# Patient Record
Sex: Female | Born: 1990 | Race: White | Hispanic: Yes | Marital: Single | Smoking: Never smoker
Health system: Southern US, Community
[De-identification: ages and names within clinical notes are randomized; demographics above are authoritative.]

## PROBLEM LIST (undated history)

## (undated) DIAGNOSIS — F410 Panic disorder [episodic paroxysmal anxiety] without agoraphobia: Secondary | ICD-10-CM

---
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
## 2011-01-07 ENCOUNTER — Emergency Department (HOSPITAL_COMMUNITY)
Admission: EM | Admit: 2011-01-07 | Discharge: 2011-01-07 | Disposition: A | Discharge status: Home / Self Care | Payer: Self-pay | Attending: Emergency Medicine | Admitting: Emergency Medicine

## 2011-01-07 DIAGNOSIS — R070 Pain in throat: Secondary | ICD-10-CM | POA: Insufficient documentation

## 2011-01-07 DIAGNOSIS — R05 Cough: Secondary | ICD-10-CM | POA: Insufficient documentation

## 2011-01-07 DIAGNOSIS — R059 Cough, unspecified: Secondary | ICD-10-CM | POA: Insufficient documentation

## 2011-01-07 DIAGNOSIS — R6889 Other general symptoms and signs: Secondary | ICD-10-CM | POA: Insufficient documentation

## 2011-01-07 DIAGNOSIS — J358 Other chronic diseases of tonsils and adenoids: Secondary | ICD-10-CM | POA: Insufficient documentation

## 2014-04-06 ENCOUNTER — Emergency Department (HOSPITAL_COMMUNITY): Payer: Self-pay

## 2014-04-06 ENCOUNTER — Emergency Department (HOSPITAL_COMMUNITY)
Admission: EM | Admit: 2014-04-06 | Discharge: 2014-04-06 | Disposition: A | Discharge status: Home / Self Care | Payer: Self-pay | Attending: Emergency Medicine | Admitting: Emergency Medicine

## 2014-04-06 ENCOUNTER — Encounter (HOSPITAL_COMMUNITY): Payer: Self-pay | Admitting: Emergency Medicine

## 2014-04-06 DIAGNOSIS — R0602 Shortness of breath: Secondary | ICD-10-CM | POA: Insufficient documentation

## 2014-04-06 DIAGNOSIS — J029 Acute pharyngitis, unspecified: Secondary | ICD-10-CM | POA: Insufficient documentation

## 2014-04-06 DIAGNOSIS — R1110 Vomiting, unspecified: Secondary | ICD-10-CM | POA: Insufficient documentation

## 2014-04-06 DIAGNOSIS — Z3202 Encounter for pregnancy test, result negative: Secondary | ICD-10-CM | POA: Insufficient documentation

## 2014-04-06 DIAGNOSIS — R000 Tachycardia, unspecified: Secondary | ICD-10-CM | POA: Insufficient documentation

## 2014-04-06 DIAGNOSIS — R509 Fever, unspecified: Secondary | ICD-10-CM | POA: Insufficient documentation

## 2014-04-06 LAB — CBC
HCT: 37.6 % (ref 36.0–46.0)
Hemoglobin: 12.7 g/dL (ref 12.0–15.0)
MCH: 28.4 pg (ref 26.0–34.0)
MCHC: 33.8 g/dL (ref 30.0–36.0)
MCV: 84.1 fL (ref 78.0–100.0)
PLATELETS: 300 10*3/uL (ref 150–400)
RBC: 4.47 MIL/uL (ref 3.87–5.11)
RDW: 12.6 % (ref 11.5–15.5)
WBC: 17 10*3/uL — ABNORMAL HIGH (ref 4.0–10.5)

## 2014-04-06 LAB — RAPID STREP SCREEN (MED CTR MEBANE ONLY): Streptococcus, Group A Screen (Direct): NEGATIVE

## 2014-04-06 LAB — BASIC METABOLIC PANEL
ANION GAP: 22 — AB (ref 5–15)
BUN: 10 mg/dL (ref 6–23)
CHLORIDE: 95 meq/L — AB (ref 96–112)
CO2: 20 mEq/L (ref 19–32)
CREATININE: 0.61 mg/dL (ref 0.50–1.10)
Calcium: 9.4 mg/dL (ref 8.4–10.5)
Glucose, Bld: 99 mg/dL (ref 70–99)
Potassium: 3.5 mEq/L — ABNORMAL LOW (ref 3.7–5.3)
Sodium: 137 mEq/L (ref 137–147)

## 2014-04-06 LAB — I-STAT CG4 LACTIC ACID, ED
Lactic Acid, Venous: 3.44 mmol/L — ABNORMAL HIGH (ref 0.5–2.2)
Lactic Acid, Venous: 0.94 mmol/L (ref 0.5–2.2)

## 2014-04-06 MED ORDER — SODIUM CHLORIDE 0.9 % IV BOLUS (SEPSIS)
2000.0000 mL | Freq: Once | INTRAVENOUS | Status: AC
  Administered 2014-04-06: 2000 mL via INTRAVENOUS

## 2014-04-06 MED ORDER — ACETAMINOPHEN 325 MG PO TABS
650.0000 mg | ORAL_TABLET | Freq: Four times a day (QID) | ORAL | Status: DC | PRN
  Administered 2014-04-06: 650 mg via ORAL
  Filled 2014-04-06: qty 2

## 2014-04-06 MED ORDER — KETOROLAC TROMETHAMINE 30 MG/ML IJ SOLN
30.0000 mg | Freq: Once | INTRAMUSCULAR | Status: AC
  Administered 2014-04-06: 30 mg via INTRAVENOUS
  Filled 2014-04-06: qty 1

## 2014-04-06 MED ORDER — SODIUM CHLORIDE 0.9 % IV BOLUS (SEPSIS)
1000.0000 mL | Freq: Once | INTRAVENOUS | Status: AC
  Administered 2014-04-06: 1000 mL via INTRAVENOUS

## 2014-04-06 NOTE — ED Provider Notes (Signed)
CSN: [id number]     Arrival date & time 04/06/14  1635 History   First MD Initiated Contact with Patient 04/06/14 1657     Chief Complaint  Patient presents with  . Fever  . Sore Throat     (Consider location/radiation/quality/duration/timing/severity/associated sxs/prior Treatment) Patient is a 23 y.o. female presenting with fever and pharyngitis. The history is provided by the patient.  Fever Max temp prior to arrival:  103 Temp source:  Oral Severity:  Moderate Onset quality:  Gradual Duration:  2 days Timing:  Intermittent Progression:  Unchanged Chronicity:  New Relieved by:  Nothing Worsened by:  Nothing tried Associated symptoms: cough, sore throat and vomiting   Associated symptoms: no chest pain and no rhinorrhea   Sore Throat Associated symptoms include shortness of breath. Pertinent negatives include no chest pain.    History reviewed. No pertinent past medical history. History reviewed. No pertinent past surgical history. No family history on file. History  Substance Use Topics  . Smoking status: Never Smoker   . Smokeless tobacco: Not on file  . Alcohol Use: No   OB History   Grav Para Term Preterm Abortions TAB SAB Ect Mult Living                 Review of Systems  Constitutional: Positive for fever.  HENT: Positive for sore throat. Negative for rhinorrhea.   Respiratory: Positive for cough and shortness of breath.   Cardiovascular: Negative for chest pain and leg swelling.  Gastrointestinal: Positive for vomiting.  All other systems reviewed and are negative.     Allergies  Nickel  Home Medications   Prior to Admission medications   Medication Sig Start Date End Date Taking? Authorizing Provider  Pseudoephedrine-APAP-DM (DAYQUIL MULTI-SYMPTOM COLD/FLU PO) Take 1 capsule by mouth daily as needed (for cold).   Yes Historical Provider, MD  sodium-potassium bicarbonate (ALKA-SELTZER GOLD) TBEF dissolvable tablet Take 1 tablet by mouth 2 (two)  times daily as needed (for congestion).    Yes Historical Provider, MD   BP 119/97  Pulse 125  Temp(Src) 103.3 F (39.6 C) (Oral)  Resp 28  SpO2 99% Physical Exam  Nursing note and vitals reviewed. Constitutional: She is oriented to person, place, and time. She appears well-developed and well-nourished. No distress.  HENT:  Head: Normocephalic and atraumatic.  Mouth/Throat: Oropharynx is clear and moist.  Eyes: EOM are normal. Pupils are equal, round, and reactive to light.  Neck: Normal range of motion. Neck supple.  Cardiovascular: Regular rhythm.  Tachycardia present.  Exam reveals no friction rub.   No murmur heard. Pulmonary/Chest: Effort normal and breath sounds normal. No respiratory distress. She has no wheezes. She has no rales.  Abdominal: Soft. She exhibits no distension. There is no tenderness. There is no rebound.  Musculoskeletal: Normal range of motion. She exhibits no edema.  Neurological: She is alert and oriented to person, place, and time.  Skin: She is not diaphoretic.    ED Course  Procedures (including critical care time) Labs Review Labs Reviewed  CBC - Abnormal; Notable for the following:    WBC 17.0 (*)    All other components within normal limits  BASIC METABOLIC PANEL - Abnormal; Notable for the following:    Potassium 3.5 (*)    Chloride 95 (*)    Anion gap 22 (*)    All other components within normal limits  I-STAT CG4 LACTIC ACID, ED - Abnormal; Notable for the following:    Lactic Acid, Venous  3.44 (*)    All other components within normal limits  RAPID STREP SCREEN  CULTURE, GROUP A STREP  POC URINE PREG, ED  I-STAT CG4 LACTIC ACID, ED    Imaging Review Dg Chest 2 View  04/06/2014   CLINICAL DATA:  Cough and fever  EXAM: CHEST  2 VIEW  COMPARISON:  None.  FINDINGS: Lungs are clear. Heart size and pulmonary vascularity are normal. No adenopathy. No bone lesions.  IMPRESSION: No abnormality noted.   Electronically Signed   By: Bretta Bang M.D.   On: 04/06/2014 17:53     EKG Interpretation None      MDM   Final diagnoses:  Viral pharyngitis    26F presents with sore throat, fever for past 2 days. Some sick contacts at school. Vomited x 2 today. No abdominal pain, no dysuria. Associated cough. No rhinorrhea, no otalgia.  Patient on exam with red oropharynx, no exudates. Neck supple, uvula midline, no airway obstruction. LIkely viral pharyngitis, will swab for strep. Fluids, antipyretics given. No concern for meningitis with supple neck.  Feeling better after fluids, lactate trended down. Patient stable for discharge.   Elwin Mocha, MD 04/06/14 [id number]

## 2014-04-06 NOTE — ED Notes (Addendum)
Pt presents to department for evaluation of sore throat and fever. Pt screaming, breathing rapidly and crying upon arrival to triage. Also c/o numbness and tingling to bilateral arms and legs. Pt is alert and oriented x4.

## 2014-04-06 NOTE — ED Notes (Signed)
NOTIFIED DR. Gwendolyn Grant FOR PATIENTS PANIC LAB RESULTS OF CG4+ LACTIC ACID = 3.44 mmol/L . :33 PM ,04/06/2014.

## 2014-04-06 NOTE — ED Notes (Signed)
Brought pt back to room via wheelchair; pt was appearing to be hyperventilating; kept talking to pt to ease her and pt started to calm down and started to breath at a normal rate; pt undressed and got into a gown; placed her on monitor, continuous pulse oximetry and blood pressure cuff; Christina, RN present in room; pt speaking and breathing at a normal rate, 110-112 heart rate and pt was talking about calling her mother to let her know where she was; told pt she was going to be ok; pt smiled and said thank you

## 2014-04-06 NOTE — Discharge Instructions (Signed)

## 2014-04-07 LAB — POC URINE PREG, ED: Preg Test, Ur: NEGATIVE

## 2014-04-08 LAB — CULTURE, GROUP A STREP

## 2014-05-15 ENCOUNTER — Emergency Department (HOSPITAL_COMMUNITY)
Admission: EM | Admit: 2014-05-15 | Discharge: 2014-05-15 | Disposition: A | Discharge status: Home / Self Care | Payer: Self-pay | Attending: Emergency Medicine | Admitting: Emergency Medicine

## 2014-05-15 ENCOUNTER — Emergency Department (HOSPITAL_COMMUNITY): Payer: Self-pay

## 2014-05-15 ENCOUNTER — Encounter (HOSPITAL_COMMUNITY): Payer: Self-pay | Admitting: Emergency Medicine

## 2014-05-15 DIAGNOSIS — R05 Cough: Secondary | ICD-10-CM | POA: Insufficient documentation

## 2014-05-15 DIAGNOSIS — Z79899 Other long term (current) drug therapy: Secondary | ICD-10-CM | POA: Insufficient documentation

## 2014-05-15 DIAGNOSIS — J209 Acute bronchitis, unspecified: Secondary | ICD-10-CM | POA: Insufficient documentation

## 2014-05-15 DIAGNOSIS — J40 Bronchitis, not specified as acute or chronic: Secondary | ICD-10-CM

## 2014-05-15 DIAGNOSIS — R059 Cough, unspecified: Secondary | ICD-10-CM

## 2014-05-15 MED ORDER — AEROCHAMBER PLUS FLO-VU MEDIUM MISC
1.0000 | Freq: Once | Status: AC
  Administered 2014-05-15: 1
  Filled 2014-05-15: qty 1

## 2014-05-15 MED ORDER — HYDROCOD POLST-CHLORPHEN POLST 10-8 MG/5ML PO LQCR: 5.0000 mL | Freq: Two times a day (BID) | ORAL | Status: DC | PRN

## 2014-05-15 MED ORDER — HYDROCOD POLST-CHLORPHEN POLST 10-8 MG/5ML PO LQCR
5.0000 mL | Freq: Once | ORAL | Status: AC
  Administered 2014-05-15: 5 mL via ORAL
  Filled 2014-05-15: qty 5

## 2014-05-15 MED ORDER — BENZONATATE 100 MG PO CAPS
100.0000 mg | ORAL_CAPSULE | Freq: Once | ORAL | Status: AC
  Administered 2014-05-15: 100 mg via ORAL
  Filled 2014-05-15: qty 1

## 2014-05-15 MED ORDER — ALBUTEROL SULFATE HFA 108 (90 BASE) MCG/ACT IN AERS
6.0000 | INHALATION_SPRAY | Freq: Once | RESPIRATORY_TRACT | Status: AC
  Administered 2014-05-15: 6 via RESPIRATORY_TRACT
  Filled 2014-05-15: qty 6.7

## 2014-05-15 MED ORDER — AZITHROMYCIN 250 MG PO TABS: 250.0000 mg | ORAL_TABLET | Freq: Every day | ORAL | Status: DC

## 2014-05-15 NOTE — ED Provider Notes (Signed)
CSN: [id number]     Arrival date & time 05/15/14  0806 History   First MD Initiated Contact with Patient 05/15/14 0809     Chief Complaint  Patient presents with  . Cough  . Hemoptysis     (Consider location/radiation/quality/duration/timing/severity/associated sxs/prior Treatment) Patient is a 23 y.o. female presenting with cough.  Cough Cough characteristics:  Dry (Initially productive) Severity:  Severe Onset quality:  Gradual Duration:  6 weeks Timing:  Constant Progression:  Waxing and waning Context: sick contacts and upper respiratory infection   Context comment:  Diagnosed with pharyngitis 6 weeks ago. Relieved by:  Nothing Ineffective treatments: cough drops, Dayquil. Associated symptoms: shortness of breath (feeling that she can't get a full breath), sinus congestion and sore throat   Associated symptoms: no fever   Associated symptoms comment:  Post tussive emesis   History reviewed. No pertinent past medical history. History reviewed. No pertinent past surgical history. No family history on file. History  Substance Use Topics  . Smoking status: Never Smoker   . Smokeless tobacco: Not on file  . Alcohol Use: Yes     Comment: socially   OB History   Grav Para Term Preterm Abortions TAB SAB Ect Mult Living                 Review of Systems  Constitutional: Negative for fever.  HENT: Positive for sore throat.   Respiratory: Positive for cough and shortness of breath (feeling that she can't get a full breath).   All other systems reviewed and are negative.     Allergies  Nickel  Home Medications   Prior to Admission medications   Medication Sig Start Date End Date Taking? Authorizing Provider  acetaminophen (TYLENOL) 500 MG tablet Take 1,000 mg by mouth every 6 (six) hours as needed for moderate pain.   Yes Historical Provider, MD  DM-Phenylephrine-Acetaminophen (VICKS DAYQUIL MULTI-SYMPTOM) 10-5-325 MG/15ML LIQD Take 10 mLs by mouth 2 (two) times  daily as needed (for cough and congestion).   Yes Historical Provider, MD   Ht 5\' 4"  (1.626 m)  Wt 135 lb (61.236 kg)  BMI 23.16 kg/m2  SpO2 99%  LMP 05/11/2014 Physical Exam  Nursing note and vitals reviewed. Constitutional: She is oriented to person, place, and time. She appears well-developed and well-nourished. No distress.  HENT:  Head: Normocephalic and atraumatic.  Mouth/Throat: Mucous membranes are normal. Posterior oropharyngeal erythema present. No oropharyngeal exudate, posterior oropharyngeal edema or tonsillar abscesses.  Eyes: Conjunctivae are normal. Pupils are equal, round, and reactive to light. No scleral icterus.  Neck: Neck supple.  Cardiovascular: Normal rate, regular rhythm, normal heart sounds and intact distal pulses.   No murmur heard. Pulmonary/Chest: Effort normal and breath sounds normal. No stridor. No respiratory distress. She has no rales.  Frequently coughing  Abdominal: Soft. Bowel sounds are normal. She exhibits no distension. There is no tenderness.  Musculoskeletal: Normal range of motion. She exhibits no edema.  Neurological: She is alert and oriented to person, place, and time.  Skin: Skin is warm and dry. No rash noted.  Psychiatric: She has a normal mood and affect. Her behavior is normal.    ED Course  Procedures (including critical care time) Labs Review Labs Reviewed - No data to display  Imaging Review Dg Chest 2 View  05/15/2014   CLINICAL DATA:  Cough.  EXAM: CHEST  2 VIEW  COMPARISON:  04/06/2014  FINDINGS: The heart size and mediastinal contours are within normal limits. Both lungs  are clear. The visualized skeletal structures are unremarkable.  IMPRESSION: No active cardiopulmonary disease.   Electronically Signed   By: Signa Kellaylor  Stroud M.D.   On: 05/15/2014 10:04  All radiology studies independently viewed by me.      EKG Interpretation None      MDM   Final diagnoses:  Cough  Bronchitis    23 yo female with cough for  several weeks, now worsening.  Had a little blood tinged sputum, prompting her ED visit today.  Nontoxic with stable vitals.  She is, however, coughing frequently.  Not at high risk for TB.  She had all her vaccines a few years ago while in the [employer].    Plan CXR, albuterol, and reassess.    CXR negative.  Cough a little better after tussionex.  Plan dc with azithromycin and PCP follow up. Return precautions given.  Warnell Foresterrey Samnang Shugars, MD 05/16/14 1258

## 2014-05-15 NOTE — ED Notes (Signed)
Per pt. She was diagnosed with laryngitis on sept 7, and has not stopped coughing since. Pt states that around 0700 she coughed up a small amount of bright red blood one time.

## 2014-05-15 NOTE — Discharge Instructions (Signed)
Cough, Adult  A cough is a reflex that helps clear your throat and airways. It can help heal the body or may be a reaction to an irritated airway. A cough may only last 2 or 3 weeks (acute) or may last more than 8 weeks (chronic).  CAUSES Acute cough:  Viral or bacterial infections. Chronic cough:  Infections.  Allergies.  Asthma.  Post-nasal drip.  Smoking.  Heartburn or acid reflux.  Some medicines.  Chronic lung problems (COPD).  Cancer. SYMPTOMS   Cough.  Fever.  Chest pain.  Increased breathing rate.  High-pitched whistling sound when breathing (wheezing).  Colored mucus that you cough up (sputum). TREATMENT   A bacterial cough may be treated with antibiotic medicine.  A viral cough must run its course and will not respond to antibiotics.  Your caregiver may recommend other treatments if you have a chronic cough. HOME CARE INSTRUCTIONS   Only take over-the-counter or prescription medicines for pain, discomfort, or fever as directed by your caregiver. Use cough suppressants only as directed by your caregiver.  Use a cold steam vaporizer or humidifier in your bedroom or home to help loosen secretions.  Sleep in a semi-upright position if your cough is worse at night.  Rest as needed.  Stop smoking if you smoke. SEEK IMMEDIATE MEDICAL CARE IF:   You have pus in your sputum.  Your cough starts to worsen.  You cannot control your cough with suppressants and are losing sleep.  You begin coughing up blood.  You have difficulty breathing.  You develop pain which is getting worse or is uncontrolled with medicine.  You have a fever. MAKE SURE YOU:   Understand these instructions.  Will watch your condition.  Will get help right away if you are not doing well or get worse. Document Released: 01/11/2011 Document Revised: 10/07/2011 Document Reviewed: 01/11/2011 Exit[employer] Patient Information 2015 [employer]. This information is not intended  to replace advice given to you by your health care provider. Make sure you discuss any questions you have with your health care provider.  

## 2015-09-15 DIAGNOSIS — R000 Tachycardia, unspecified: Secondary | ICD-10-CM | POA: Diagnosis not present

## 2015-09-15 DIAGNOSIS — R42 Dizziness and giddiness: Secondary | ICD-10-CM | POA: Insufficient documentation

## 2015-09-15 DIAGNOSIS — R1110 Vomiting, unspecified: Secondary | ICD-10-CM | POA: Diagnosis not present

## 2015-09-15 DIAGNOSIS — Z792 Long term (current) use of antibiotics: Secondary | ICD-10-CM | POA: Insufficient documentation

## 2015-09-15 DIAGNOSIS — J3489 Other specified disorders of nose and nasal sinuses: Secondary | ICD-10-CM | POA: Diagnosis not present

## 2015-09-15 DIAGNOSIS — R509 Fever, unspecified: Secondary | ICD-10-CM | POA: Insufficient documentation

## 2015-09-15 DIAGNOSIS — R1310 Dysphagia, unspecified: Secondary | ICD-10-CM | POA: Insufficient documentation

## 2015-09-15 DIAGNOSIS — J029 Acute pharyngitis, unspecified: Secondary | ICD-10-CM | POA: Insufficient documentation

## 2015-09-15 DIAGNOSIS — R05 Cough: Secondary | ICD-10-CM | POA: Diagnosis not present

## 2015-09-16 ENCOUNTER — Emergency Department (HOSPITAL_COMMUNITY)
Admission: EM | Admit: 2015-09-16 | Discharge: 2015-09-16 | Disposition: A | Discharge status: Home / Self Care | Payer: BLUE CROSS/BLUE SHIELD | Attending: Emergency Medicine | Admitting: Emergency Medicine

## 2015-09-16 ENCOUNTER — Encounter (HOSPITAL_COMMUNITY): Payer: Self-pay | Admitting: Emergency Medicine

## 2015-09-16 ENCOUNTER — Emergency Department (HOSPITAL_COMMUNITY): Payer: BLUE CROSS/BLUE SHIELD

## 2015-09-16 DIAGNOSIS — R6889 Other general symptoms and signs: Secondary | ICD-10-CM

## 2015-09-16 LAB — URINALYSIS, ROUTINE W REFLEX MICROSCOPIC
GLUCOSE, UA: NEGATIVE mg/dL
NITRITE: NEGATIVE
PH: 5.5 (ref 5.0–8.0)
PROTEIN: NEGATIVE mg/dL
Specific Gravity, Urine: 1.029 (ref 1.005–1.030)

## 2015-09-16 LAB — POC URINE PREG, ED: Preg Test, Ur: NEGATIVE

## 2015-09-16 LAB — CBC WITH DIFFERENTIAL/PLATELET
BASOS ABS: 0 10*3/uL (ref 0.0–0.1)
Basophils Relative: 0 %
EOS ABS: 0 10*3/uL (ref 0.0–0.7)
Eosinophils Relative: 1 %
HCT: 39.1 % (ref 36.0–46.0)
Hemoglobin: 13.5 g/dL (ref 12.0–15.0)
Lymphocytes Relative: 31 %
Lymphs Abs: 1.5 10*3/uL (ref 0.7–4.0)
MCH: 29.7 pg (ref 26.0–34.0)
MCHC: 34.5 g/dL (ref 30.0–36.0)
MCV: 86.1 fL (ref 78.0–100.0)
MONO ABS: 0.5 10*3/uL (ref 0.1–1.0)
MONOS PCT: 11 %
Neutro Abs: 2.7 10*3/uL (ref 1.7–7.7)
Neutrophils Relative %: 57 %
Platelets: 207 10*3/uL (ref 150–400)
RBC: 4.54 MIL/uL (ref 3.87–5.11)
RDW: 12.7 % (ref 11.5–15.5)
WBC: 4.8 10*3/uL (ref 4.0–10.5)

## 2015-09-16 LAB — BASIC METABOLIC PANEL
Anion gap: 13 (ref 5–15)
BUN: 10 mg/dL (ref 6–20)
CALCIUM: 9.4 mg/dL (ref 8.9–10.3)
CO2: 23 mmol/L (ref 22–32)
CREATININE: 0.59 mg/dL (ref 0.44–1.00)
Chloride: 103 mmol/L (ref 101–111)
GFR calc Af Amer: >60 mL/min (ref >60)
Glucose, Bld: 89 mg/dL (ref 65–99)
Potassium: 4.2 mmol/L (ref 3.5–5.1)
Sodium: 139 mmol/L (ref 135–145)

## 2015-09-16 LAB — URINE MICROSCOPIC-ADD ON

## 2015-09-16 LAB — RAPID STREP SCREEN (MED CTR MEBANE ONLY): Streptococcus, Group A Screen (Direct): NEGATIVE

## 2015-09-16 MED ORDER — ACETAMINOPHEN 325 MG PO TABS
ORAL_TABLET | ORAL | Status: AC
  Filled 2015-09-16: qty 2

## 2015-09-16 MED ORDER — SODIUM CHLORIDE 0.9 % IV BOLUS (SEPSIS)
1000.0000 mL | Freq: Once | INTRAVENOUS | Status: AC
  Administered 2015-09-16: 1000 mL via INTRAVENOUS

## 2015-09-16 MED ORDER — MAGIC MOUTHWASH W/LIDOCAINE: 5.0000 mL | Freq: Three times a day (TID) | ORAL | Status: DC | PRN

## 2015-09-16 MED ORDER — DEXAMETHASONE SODIUM PHOSPHATE 10 MG/ML IJ SOLN
10.0000 mg | Freq: Once | INTRAMUSCULAR | Status: AC
  Administered 2015-09-16: 10 mg via INTRAVENOUS
  Filled 2015-09-16: qty 1

## 2015-09-16 MED ORDER — IBUPROFEN 600 MG PO TABS: 600.0000 mg | ORAL_TABLET | Freq: Four times a day (QID) | ORAL | Status: DC | PRN

## 2015-09-16 MED ORDER — ACETAMINOPHEN 325 MG PO TABS
650.0000 mg | ORAL_TABLET | Freq: Once | ORAL | Status: AC
  Administered 2015-09-16: 650 mg via ORAL

## 2015-09-16 NOTE — ED Provider Notes (Addendum)
CSN: [id number]     Arrival date & time 09/15/15  2342 History  By signing my name below, I, Traci Oconnell, attest that this documentation has been prepared under the direction and in the presence of Derwood Kaplan, MD. Electronically Signed: Angelene Giovanni, ED Scribe. 09/16/2015. 12:51 AM.    Chief Complaint  Patient presents with  . Fever  . Cough  . Generalized Body Aches  . Chills   The history is provided by the patient. No language interpreter was used.   HPI Comments: Traci Oconnell is a 25 y.o. female with a hx of Raynaud's syndrome who presents to the Emergency Department complaining of gradually worsening persistent non-productive cough that becomes productive at night with white and green sputum onset 5 days ago. She reports associated sore throat, trouble swallowing, decreased in drinking, rhinorrhea, fever of 102.3 PTA, chills, and one episode of vomiting today. She reports sick contacts at work. She explains that she is here because she had an episode of dizziness this evening and almost fainted. She states that she has been taking Dayquil and Nyquil with no relief. Pt's flu vaccine is not UTD. Pt's LNMP was on 09/05/15. She denies taking any medication for her raynaud's syndrome. No SOB, CP, or abdominal pain.    History reviewed. No pertinent past medical history. History reviewed. No pertinent past surgical history. No family history on file. Social History  Substance Use Topics  . Smoking status: Never Smoker   . Smokeless tobacco: None  . Alcohol Use: Yes     Comment: socially   OB History    No data available     Review of Systems  Constitutional: Positive for fever and chills.  HENT: Positive for rhinorrhea, sore throat and trouble swallowing.   Respiratory: Positive for cough. Negative for shortness of breath.   Cardiovascular: Negative for chest pain.  Gastrointestinal: Positive for vomiting. Negative for abdominal pain.      Allergies   Nickel  Home Medications   Prior to Admission medications   Medication Sig Start Date End Date Taking? Authorizing Provider  acetaminophen (TYLENOL) 500 MG tablet Take 1,000 mg by mouth every 6 (six) hours as needed for moderate pain.    Historical Provider, MD  azithromycin (ZITHROMAX) 250 MG tablet Take 1 tablet (250 mg total) by mouth daily. Take first 2 tablets together, then 1 every day until finished. 05/15/14   Blake Divine, MD  chlorpheniramine-HYDROcodone (TUSSIONEX PENNKINETIC ER) 10-8 MG/5ML LQCR Take 5 mLs by mouth every 12 (twelve) hours as needed for cough. 05/15/14   Blake Divine, MD  DM-Phenylephrine-Acetaminophen (VICKS DAYQUIL MULTI-SYMPTOM) 10-5-325 MG/15ML LIQD Take 10 mLs by mouth 2 (two) times daily as needed (for cough and congestion).    Historical Provider, MD  ibuprofen (ADVIL,MOTRIN) 600 MG tablet Take 1 tablet (600 mg total) by mouth every 6 (six) hours as needed. 09/16/15   Derwood Kaplan, MD  magic mouthwash w/lidocaine SOLN Take 5 mLs by mouth 3 (three) times daily as needed for mouth pain. 09/16/15   Neri Vieyra Rhunette Croft, MD   BP 109/67 mmHg  Pulse 87  Temp(Src) 99.2 F (37.3 C) (Oral)  Resp 18  Ht  (1.626 m)  Wt 118 lb 1 oz (53.553 kg)  BMI 20.26 kg/m2  SpO2 100%  LMP 09/02/2015 (Approximate) Physical Exam  Constitutional: She appears well-developed and well-nourished. No distress.  HENT:  Head: Normocephalic and atraumatic.  Mouth/Throat: Posterior oropharyngeal erythema present. No oropharyngeal exudate.  Posterior pharynx erythema without any exudates Enlarged  tonsils   Eyes: Conjunctivae and EOM are normal. Pupils are equal, round, and reactive to light. Right eye exhibits no discharge. Left eye exhibits no discharge. No scleral icterus.  Neck: Normal range of motion. Neck supple. No JVD present. No thyromegaly present.  Cardiovascular: Normal rate, regular rhythm, normal heart sounds and intact distal pulses.  Exam reveals no gallop and no friction  rub.   No murmur heard. Pulmonary/Chest: Effort normal and breath sounds normal. No respiratory distress. She has no wheezes. She has no rales.  Lungs clear to auscultation   Abdominal: Soft. Bowel sounds are normal. She exhibits no distension and no mass. There is no tenderness.  Musculoskeletal: Normal range of motion. She exhibits no edema or tenderness.  Lymphadenopathy:    She has cervical adenopathy (anterior).  Neurological: She is alert. Coordination normal.  Skin: Skin is warm and dry. No rash noted. No erythema.  Psychiatric: She has a normal mood and affect. Her behavior is normal.  Nursing note and vitals reviewed.   ED Course  Procedures (including critical care time) DIAGNOSTIC STUDIES: Oxygen Saturation is 99% on RA, normal by my interpretation.    COORDINATION OF CARE: 12:50 AM- Pt advised of plan for treatment and pt agrees. Pt will receive IV fluids and Tylenol. She will also receive a chest x-ray for further evaluation.    Labs Review Labs Reviewed  URINALYSIS, ROUTINE W REFLEX MICROSCOPIC (NOT AT [facility]) - Abnormal; Notable for the following:    APPearance CLOUDY (*)    Hgb urine dipstick MODERATE (*)    Bilirubin Urine SMALL (*)    Ketones, ur >80 (*)    Leukocytes, UA SMALL (*)    All other components within normal limits  URINE MICROSCOPIC-ADD ON - Abnormal; Notable for the following:    Squamous Epithelial / LPF 0-5 (*)    Bacteria, UA RARE (*)    All other components within normal limits  RAPID STREP SCREEN (NOT AT [facility])  CULTURE, GROUP A STREP ([facility])  CBC WITH DIFFERENTIAL/PLATELET  BASIC METABOLIC PANEL  POC URINE PREG, ED    Imaging Review Dg Chest 2 View  09/16/2015  CLINICAL DATA:  Fever, chills, body aches and cough this evening. EXAM: CHEST  2 VIEW COMPARISON:  Chest radiograph May 15, 2014 FINDINGS: Cardiomediastinal silhouette is normal. The lungs are clear without pleural effusions or focal consolidations. Trachea projects midline and  there is no pneumothorax. Soft tissue planes and included osseous structures are non-suspicious. IMPRESSION: Normal chest. Electronically Signed   By: Awilda Metro M.D.   On: 09/16/2015 00:46     Derwood Kaplan, MD has personally reviewed and evaluated these images and lab results as part of his medical decision-making.   MDM   Final diagnoses:  Flu-like symptoms    I personally performed the services described in this documentation, which was scribed in my presence. The recorded information has been reviewed and is accurate.  Pt comes in with flu like symptoms and dizziness. Dizziness likely due to poor oral intake and dehydration.  Pt is febrile and has tachycardia - throat looks erythematous. Likely has flu - but she is healthy and well outside the treatment window. Triage Xrays and rapid strep pending. Will hydrate due to the near fainting and give decadron for inflammation.  Derwood Kaplan, MD 09/16/15 1610  Derwood Kaplan, MD 09/16/15 [phone]

## 2015-09-16 NOTE — Discharge Instructions (Signed)
Viral Infections °A viral infection can be caused by different types of viruses. Most viral infections are not serious and resolve on their own. However, some infections may cause severe symptoms and may lead to further complications. °SYMPTOMS °Viruses can frequently cause: °· Minor sore throat. °· Aches and pains. °· Headaches. °· Runny nose. °· Different types of rashes. °· Watery eyes. °· Tiredness. °· Cough. °· Loss of appetite. °· Gastrointestinal infections, resulting in nausea, vomiting, and diarrhea. °These symptoms do not respond to antibiotics because the infection is not caused by bacteria. However, you might catch a bacterial infection following the viral infection. This is sometimes called a "superinfection." Symptoms of such a bacterial infection may include: °· Worsening sore throat with pus and difficulty swallowing. °· Swollen neck glands. °· Chills and a high or persistent fever. °· Severe headache. °· Tenderness over the sinuses. °· Persistent overall ill feeling (malaise), muscle aches, and tiredness (fatigue). °· Persistent cough. °· Yellow, green, or brown mucus production with coughing. °HOME CARE INSTRUCTIONS  °· Only take over-the-counter or prescription medicines for pain, discomfort, diarrhea, or fever as directed by your caregiver. °· Drink enough water and fluids to keep your urine clear or pale yellow. Sports drinks can provide valuable electrolytes, sugars, and hydration. °· Get plenty of rest and maintain proper nutrition. Soups and broths with crackers or rice are fine. °SEEK IMMEDIATE MEDICAL CARE IF:  °· You have severe headaches, shortness of breath, chest pain, neck pain, or an unusual rash. °· You have uncontrolled vomiting, diarrhea, or you are unable to keep down fluids. °· You or your child has an oral temperature above 102° F (38.9° C), not controlled by medicine. °· Your baby is older than 3 months with a rectal temperature of 102° F (38.9° C) or higher. °· Your baby is 3  months old or younger with a rectal temperature of 100.4° F (38° C) or higher. °MAKE SURE YOU:  °· Understand these instructions. °· Will watch your condition. °· Will get help right away if you are not doing well or get worse. °  °This information is not intended to replace advice given to you by your health care provider. Make sure you discuss any questions you have with your health care provider. °  °Document Released: 04/24/2005 Document Revised: 10/07/2011 Document Reviewed: 12/21/2014 °Elsevier Interactive Patient Education ©2016 [employer] ° °

## 2015-09-16 NOTE — ED Notes (Signed)
Pt. reports fever , chills , body aches , persistent dry cough , nasal congestion/runny nose and emesis this evening .

## 2015-09-18 LAB — CULTURE, GROUP A STREP (THRC)

## 2015-09-27 ENCOUNTER — Ambulatory Visit (INDEPENDENT_AMBULATORY_CARE_PROVIDER_SITE_OTHER): Payer: BLUE CROSS/BLUE SHIELD | Admitting: Licensed Clinical Social Worker

## 2015-09-27 DIAGNOSIS — F324 Major depressive disorder, single episode, in partial remission: Secondary | ICD-10-CM | POA: Diagnosis not present

## 2015-10-03 ENCOUNTER — Ambulatory Visit (INDEPENDENT_AMBULATORY_CARE_PROVIDER_SITE_OTHER): Payer: BLUE CROSS/BLUE SHIELD | Admitting: Licensed Clinical Social Worker

## 2015-10-03 DIAGNOSIS — F324 Major depressive disorder, single episode, in partial remission: Secondary | ICD-10-CM | POA: Diagnosis not present

## 2015-10-11 ENCOUNTER — Ambulatory Visit (INDEPENDENT_AMBULATORY_CARE_PROVIDER_SITE_OTHER): Payer: BLUE CROSS/BLUE SHIELD | Admitting: Licensed Clinical Social Worker

## 2015-10-11 DIAGNOSIS — F324 Major depressive disorder, single episode, in partial remission: Secondary | ICD-10-CM | POA: Diagnosis not present

## 2015-10-18 ENCOUNTER — Ambulatory Visit: Payer: BLUE CROSS/BLUE SHIELD | Admitting: Licensed Clinical Social Worker

## 2015-11-01 ENCOUNTER — Ambulatory Visit: Payer: BLUE CROSS/BLUE SHIELD | Admitting: Licensed Clinical Social Worker

## 2015-11-03 ENCOUNTER — Emergency Department (HOSPITAL_COMMUNITY): Payer: 59

## 2015-11-03 ENCOUNTER — Emergency Department (HOSPITAL_COMMUNITY)
Admission: EM | Admit: 2015-11-03 | Discharge: 2015-11-03 | Disposition: A | Discharge status: Home / Self Care | Payer: 59 | Attending: Emergency Medicine | Admitting: Emergency Medicine

## 2015-11-03 ENCOUNTER — Encounter (HOSPITAL_COMMUNITY): Payer: Self-pay | Admitting: Emergency Medicine

## 2015-11-03 DIAGNOSIS — S60221A Contusion of right hand, initial encounter: Secondary | ICD-10-CM | POA: Diagnosis not present

## 2015-11-03 DIAGNOSIS — Y9389 Activity, other specified: Secondary | ICD-10-CM | POA: Diagnosis not present

## 2015-11-03 DIAGNOSIS — Y998 Other external cause status: Secondary | ICD-10-CM | POA: Insufficient documentation

## 2015-11-03 DIAGNOSIS — F410 Panic disorder [episodic paroxysmal anxiety] without agoraphobia: Secondary | ICD-10-CM | POA: Diagnosis not present

## 2015-11-03 DIAGNOSIS — S60511A Abrasion of right hand, initial encounter: Secondary | ICD-10-CM | POA: Diagnosis not present

## 2015-11-03 DIAGNOSIS — W228XXA Striking against or struck by other objects, initial encounter: Secondary | ICD-10-CM | POA: Insufficient documentation

## 2015-11-03 DIAGNOSIS — Y92003 Bedroom of unspecified non-institutional (private) residence as the place of occurrence of the external cause: Secondary | ICD-10-CM | POA: Diagnosis not present

## 2015-11-03 DIAGNOSIS — S6000XA Contusion of unspecified finger without damage to nail, initial encounter: Secondary | ICD-10-CM

## 2015-11-03 DIAGNOSIS — S6991XA Unspecified injury of right wrist, hand and finger(s), initial encounter: Secondary | ICD-10-CM | POA: Diagnosis present

## 2015-11-03 HISTORY — DX: Panic disorder (episodic paroxysmal anxiety): F41.0

## 2015-11-03 MED ORDER — HYDROCODONE-ACETAMINOPHEN 5-325 MG PO TABS
1.0000 | ORAL_TABLET | Freq: Once | ORAL | Status: DC
  Filled 2015-11-03: qty 1

## 2015-11-03 MED ORDER — IBUPROFEN 400 MG PO TABS
600.0000 mg | ORAL_TABLET | Freq: Once | ORAL | Status: AC
  Administered 2015-11-03: 600 mg via ORAL
  Filled 2015-11-03: qty 1

## 2015-11-03 NOTE — ED Provider Notes (Signed)
CSN: [id number]     Arrival date & time 11/03/15  2013 History  By signing my name below, I, Phillis Haggis, attest that this documentation has been prepared under the direction and in the presence of [facility], NP-C. Electronically Signed: Phillis Haggis, ED Scribe. 11/03/2015. 9:18 PM.  Chief Complaint  Patient presents with  . Hand Pain   Patient is a 25 y.o. female presenting with hand pain. The history is provided by the patient. No language interpreter was used.  Hand Pain This is a new problem. The current episode started 1 to 2 hours ago. The problem occurs constantly. The problem has been gradually worsening. She has tried nothing for the symptoms.  HPI Comments: Traci Oconnell is a 25 y.o. female who presents to the Emergency Department complaining of right hand pain onset two hours ago. Pt states that she had a panic attack and punched her wooden bedroom door a few times. She reports associated swelling to the hand. She currently rates her pain 5/10. She has placed ice on her hand to mild relief. She has not taken any pain medication PTA. She denies numbness or weakness. Pt's last tdap was in 2010.   Past Medical History  Diagnosis Date  . Panic attack    History reviewed. No pertinent past surgical history. No family history on file. Social History  Substance Use Topics  . Smoking status: Never Smoker   . Smokeless tobacco: None  . Alcohol Use: Yes     Comment: socially   OB History    No data available     Review of Systems  Musculoskeletal: Positive for arthralgias.       Right hand pain  Neurological: Negative for weakness and numbness.  All other systems reviewed and are negative.   Allergies  Nickel  Home Medications   Prior to Admission medications   Medication Sig Start Date End Date Taking? Authorizing Provider  acetaminophen (TYLENOL) 500 MG tablet Take 1,000 mg by mouth every 6 (six) hours as needed for moderate pain.    Historical Provider, MD   azithromycin (ZITHROMAX) 250 MG tablet Take 1 tablet (250 mg total) by mouth daily. Take first 2 tablets together, then 1 every day until finished. 05/15/14   Blake Divine, MD  chlorpheniramine-HYDROcodone (TUSSIONEX PENNKINETIC ER) 10-8 MG/5ML LQCR Take 5 mLs by mouth every 12 (twelve) hours as needed for cough. 05/15/14   Blake Divine, MD  DM-Phenylephrine-Acetaminophen (VICKS DAYQUIL MULTI-SYMPTOM) 10-5-325 MG/15ML LIQD Take 10 mLs by mouth 2 (two) times daily as needed (for cough and congestion).    Historical Provider, MD  ibuprofen (ADVIL,MOTRIN) 600 MG tablet Take 1 tablet (600 mg total) by mouth every 6 (six) hours as needed. 09/16/15   Derwood Kaplan, MD  magic mouthwash w/lidocaine SOLN Take 5 mLs by mouth 3 (three) times daily as needed for mouth pain. 09/16/15   Ankit Nanavati, MD   BP 110/66 mmHg  Pulse 65  Temp(Src) 98.2 F (36.8 C) (Oral)  Resp 18  SpO2 100%  LMP 10/25/2015 Physical Exam  Constitutional: She is oriented to person, place, and time. She appears well-developed and well-nourished. No distress.  HENT:  Head: Normocephalic and atraumatic.  Mouth/Throat: Oropharynx is clear and moist. No oropharyngeal exudate.  Eyes: Conjunctivae and EOM are normal. Pupils are equal, round, and reactive to light.  Neck: Normal range of motion. Neck supple.  Cardiovascular: Normal rate.   Pulmonary/Chest: Effort normal.  Musculoskeletal:       Right hand: She exhibits  decreased range of motion (due to pain), tenderness and swelling. She exhibits normal capillary refill. Normal sensation noted. Normal strength noted. She exhibits no thumb/finger opposition.  Dorsum of right hand: Abrasions to the web space between ring and pinky finger; one small abrasion to the PIP of the index finger: ecchymosis at the base of the middle finger with swelling and tenderness Radial pulse is 2+, adequate sensation; good touch sensation; full ROM of wrist  Neurological: She is alert and oriented to  person, place, and time.  Skin: Skin is warm and dry.  Psychiatric: She has a normal mood and affect. Her behavior is normal.    ED Course  Procedures (including critical care time) DIAGNOSTIC STUDIES: Oxygen Saturation is 100% on RA, normal by my interpretation.    COORDINATION OF CARE: 9:18 PM-Discussed treatment plan which includes x-ray with pt at bedside and pt agreed to plan.    Labs Review Labs Reviewed - No data to display  Imaging Review Dg Hand Complete Right  11/03/2015  CLINICAL DATA:  Patient punched a wall. Right hand pain and swelling. EXAM: RIGHT HAND - COMPLETE 3+ VIEW COMPARISON:  None. FINDINGS: No fracture. Joints are normally spaced and aligned. There is mild dorsal soft tissue swelling. IMPRESSION: No fracture or dislocation. Electronically Signed   By: Amie Portlandavid  Ormond M.D.   On: 11/03/2015 21:16    MDM   Patient X-Ray negative for obvious fracture or dislocation. Pain managed in ED with ibuprofen. Pt advised to follow up with orthopedics if symptoms persist for possibility of missed fracture diagnosis. Patient given brace while in ED, conservative therapy recommended and discussed. Patient will be dc home & is agreeable with above plan.  Final diagnoses:  Contusion of right hand including fingers, initial encounter   I personally performed the services described in this documentation, which was scribed in my presence. The recorded information has been reviewed and is accurate.   [address]Hope Browns PointM Neese, NP 11/04/15 0109  Arby BarretteMarcy Pfeiffer, MD 11/04/15 [phone]

## 2015-11-03 NOTE — Discharge Instructions (Signed)
Elevate the area, apply ice, wear the splint for comfort and take ibuprofen as needed for pain.  Follow up with Dr. Izora Ribasoley if symptoms persist.  Return here as needed.

## 2015-11-03 NOTE — ED Notes (Signed)
Pt ambulates independently and with steady gait at time of discharge. Discharge instructions and follow up information reviewed with patient. No other questions or concerns voiced at this time.  

## 2015-11-03 NOTE — ED Notes (Signed)
Pt reports she had a panic attack and punched a wall 1 hr ago.  C/o pain and swelling to R hand.

## 2019-12-14 ENCOUNTER — Other Ambulatory Visit: Payer: Self-pay

## 2019-12-15 ENCOUNTER — Ambulatory Visit (INDEPENDENT_AMBULATORY_CARE_PROVIDER_SITE_OTHER): Payer: PRIVATE HEALTH INSURANCE | Admitting: Family Medicine

## 2019-12-15 ENCOUNTER — Encounter: Payer: Self-pay | Admitting: Family Medicine

## 2019-12-15 VITALS — BP 115/72 | HR 88 | Temp 98.0°F | Ht 65.0 in | Wt 133.2 lb

## 2019-12-15 DIAGNOSIS — Z3009 Encounter for other general counseling and advice on contraception: Secondary | ICD-10-CM | POA: Diagnosis not present

## 2019-12-15 DIAGNOSIS — Z0000 Encounter for general adult medical examination without abnormal findings: Secondary | ICD-10-CM

## 2019-12-15 NOTE — Patient Instructions (Addendum)
[facility]  [email or web address] [address] [place] [address] P: [phone] F: [phone] [email or web address]  [facility] [email or web address] [address] [place], [place] [address] fax: [phone] [email or web address]  [email or web address]  Etonogestrel implant What is this medicine? ETONOGESTREL (et oh noe JES trel) is a contraceptive (birth control) device. It is used to prevent pregnancy. It can be used for up to 3 years. This medicine may be used for other purposes; ask your health care provider or pharmacist if you have questions. COMMON BRAND NAME(S): Implanon, Nexplanon What should I tell my health care provider before I take this medicine? They need to know if you have any of these conditions:  abnormal vaginal bleeding  blood vessel disease or blood clots  breast, cervical, endometrial, ovarian, liver, or uterine cancer  diabetes  gallbladder disease  heart disease or recent heart attack  high blood pressure  high cholesterol or triglycerides  kidney disease  liver disease  migraine headaches  seizures  stroke  tobacco smoker  an unusual or allergic reaction to etonogestrel, anesthetics or antiseptics, other medicines, foods, dyes, or preservatives  pregnant or trying to get pregnant  breast-feeding How should I use this medicine? This device is inserted just under the skin on the inner side of your upper arm by a health care professional. Talk to your pediatrician regarding the use of this medicine in children. Special care may be needed. Overdosage: If you think you have taken too much of this medicine contact a poison control center or emergency room at once. NOTE: This medicine is only for you. Do not share this medicine with others. What if I miss a  dose? This does not apply. What may interact with this medicine? Do not take this medicine with any of the following medications:  amprenavir  fosamprenavir This medicine may also interact with the following medications:  acitretin  aprepitant  armodafinil  bexarotene  bosentan  carbamazepine  certain medicines for fungal infections like fluconazole, ketoconazole, itraconazole and voriconazole  certain medicines to treat hepatitis, HIV or AIDS  cyclosporine  felbamate  griseofulvin  lamotrigine  modafinil  oxcarbazepine  phenobarbital  phenytoin  primidone  rifabutin  rifampin  rifapentine  St. John's wort  topiramate This list may not describe all possible interactions. Give your health care provider a list of all the medicines, herbs, non-prescription drugs, or dietary supplements you use. Also tell them if you smoke, drink alcohol, or use illegal drugs. Some items may interact with your medicine. What should I watch for while using this medicine? This product does not protect you against HIV infection (AIDS) or other sexually transmitted diseases. You should be able to feel the implant by pressing your fingertips over the skin where it was inserted. Contact your doctor if you cannot feel the implant, and use a non-hormonal birth control method (such as condoms) until your doctor confirms that the implant is in place. Contact your doctor if you think that the implant may have broken or become bent while in your arm. You will receive a user card from your health care provider after the implant is inserted. The card is a record of the location of the implant in your upper arm and when it should be removed. Keep this card with your health records. What side effects may I notice from receiving this medicine? Side effects that you should report to your doctor or health care professional as soon as possible:  allergic reactions like skin rash, itching or  hives, swelling of the face, lips, or tongue  breast lumps, breast tissue changes, or discharge  breathing problems  changes in emotions or moods  coughing up blood  if you feel that the implant may have broken or bent while in your arm  high blood pressure  pain, irritation, swelling, or bruising at the insertion site  scar at site of insertion  signs of infection at the insertion site such as fever, and skin redness, pain or discharge  signs and symptoms of a blood clot such as breathing problems; changes in vision; chest pain; severe, sudden headache; pain, swelling, warmth in the leg; trouble speaking; sudden numbness or weakness of the face, arm or leg  signs and symptoms of liver injury like dark yellow or brown urine; general ill feeling or flu-like symptoms; light-colored stools; loss of appetite; nausea; right upper belly pain; unusually weak or tired; yellowing of the eyes or skin  unusual vaginal bleeding, discharge Side effects that usually do not require medical attention (report to your doctor or health care professional if they continue or are bothersome):  acne  breast pain or tenderness  headache  irregular menstrual bleeding  nausea This list may not describe all possible side effects. Call your doctor for medical advice about side effects. You may report side effects to FDA at 1-800-FDA-1088. Where should I keep my medicine? This drug is given in a hospital or clinic and will not be stored at home. NOTE: This sheet is a summary. It may not cover all possible information. If you have questions about this medicine, talk to your doctor, pharmacist, or health care provider.  2020 Elsevier/Gold Standard (2019-04-27 11:33:04)
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
## 2020-01-03 ENCOUNTER — Other Ambulatory Visit: Payer: Self-pay

## 2020-01-03 ENCOUNTER — Other Ambulatory Visit (INDEPENDENT_AMBULATORY_CARE_PROVIDER_SITE_OTHER): Payer: PRIVATE HEALTH INSURANCE

## 2020-01-03 DIAGNOSIS — Z0000 Encounter for general adult medical examination without abnormal findings: Secondary | ICD-10-CM | POA: Diagnosis not present

## 2020-01-03 LAB — LIPID PANEL
Cholesterol: 173 mg/dL (ref 0–200)
HDL: 57.6 mg/dL (ref >39.00)
LDL Cholesterol: 95 mg/dL (ref 0–99)
NonHDL: 115
Total CHOL/HDL Ratio: 3
Triglycerides: 98 mg/dL (ref 0.0–149.0)
VLDL: 19.6 mg/dL (ref 0.0–40.0)

## 2020-01-03 LAB — ALT: ALT: 10 U/L (ref 0–35)

## 2020-01-03 LAB — CBC
HCT: 38 % (ref 36.0–46.0)
Hemoglobin: 12.6 g/dL (ref 12.0–15.0)
MCHC: 33.2 g/dL (ref 30.0–36.0)
MCV: 89.4 fl (ref 78.0–100.0)
Platelets: 364 10*3/uL (ref 150.0–400.0)
RBC: 4.25 Mil/uL (ref 3.87–5.11)
RDW: 13.2 % (ref 11.5–15.5)
WBC: 7.3 10*3/uL (ref 4.0–10.5)

## 2020-01-03 LAB — BASIC METABOLIC PANEL
BUN: 10 mg/dL (ref 6–23)
CO2: 27 mEq/L (ref 19–32)
Calcium: 9.4 mg/dL (ref 8.4–10.5)
Chloride: 107 mEq/L (ref 96–112)
Creatinine, Ser: 0.57 mg/dL (ref 0.40–1.20)
GFR: 125.22 mL/min (ref >60.00)
Glucose, Bld: 90 mg/dL (ref 70–99)
Potassium: 5.6 mEq/L — ABNORMAL HIGH (ref 3.5–5.1)
Sodium: 140 mEq/L (ref 135–145)

## 2020-01-03 LAB — AST: AST: 16 U/L (ref 0–37)

## 2020-01-03 LAB — VITAMIN D 25 HYDROXY (VIT D DEFICIENCY, FRACTURES): VITD: 27.25 ng/mL — ABNORMAL LOW (ref 30.00–100.00)

## 2020-01-12 ENCOUNTER — Other Ambulatory Visit: Payer: Self-pay | Admitting: Family Medicine

## 2020-01-12 ENCOUNTER — Encounter: Payer: Self-pay | Admitting: Family Medicine

## 2020-01-12 DIAGNOSIS — E875 Hyperkalemia: Secondary | ICD-10-CM

## 2020-02-22 ENCOUNTER — Telehealth (INDEPENDENT_AMBULATORY_CARE_PROVIDER_SITE_OTHER): Payer: PRIVATE HEALTH INSURANCE | Admitting: Family Medicine

## 2020-02-22 DIAGNOSIS — J029 Acute pharyngitis, unspecified: Secondary | ICD-10-CM

## 2020-02-22 MED ORDER — AMOXICILLIN 500 MG PO CAPS: 500.0000 mg | ORAL_CAPSULE | Freq: Three times a day (TID) | ORAL | 0 refills | Status: DC

## 2020-02-22 NOTE — Patient Instructions (Signed)
-  I sent the medication(s) we discussed to your pharmacy: Meds ordered this encounter  Medications   amoxicillin (AMOXIL) 500 MG capsule    Sig: Take 1 capsule (500 mg total) by mouth 3 (three) times daily.    Dispense:  20 capsule    Refill:  0    Please let us know if you have any questions or concerns regarding this prescription.  I hope you are feeling better soon! Seek care promptly if your symptoms worsen, new concerns arise or you are not improving with treatment over the next 24-48 hours.

## 2020-02-22 NOTE — Progress Notes (Signed)
Virtual Visit via Video Note  I connected with Traci Oconnell  on 02/22/20 at 11:20 AM EDT by a video enabled telemedicine application and verified that I am speaking with the correct person using two identifiers.  Location patient: home, [place] Location provider:work or home office Persons participating in the virtual visit: patient, provider  I discussed the limitations of evaluation and management by telemedicine and the availability of in person appointments. The patient expressed understanding and agreed to proceed.   HPI:  Acute visit for a sore throat: -started 3 days ago -symptoms include: sore throat, she noticed some white patches on her throat -denies fever, nasal congestion, cough, swollen glands, NVD, loss of taste or smell -she has a history of strep throat and this feels the same to her -Fully vaccinated for COVID19 -denies sick contacts, did admit to oral sex recently,but partner was well  ROS: See pertinent positives and negatives per HPI.  Past Medical History:  Diagnosis Date  . Panic attack     Past Surgical History:  Procedure Laterality Date  . WISDOM TOOTH EXTRACTION  2010    Family History  Problem Relation Age of Onset  . Anxiety disorder Mother   . Cancer Father     SOCIAL HX: see hpi   Current Outpatient Medications:  .  acetaminophen (TYLENOL) 500 MG tablet, Take 1,000 mg by mouth every 6 (six) hours as needed for moderate pain., Disp: , Rfl:  .  amoxicillin (AMOXIL) 500 MG capsule, Take 1 capsule (500 mg total) by mouth 3 (three) times daily., Disp: 20 capsule, Rfl: 0 .  DM-Phenylephrine-Acetaminophen (VICKS DAYQUIL MULTI-SYMPTOM) 10-5-325 MG/15ML LIQD, Take 10 mLs by mouth 2 (two) times daily as needed (for cough and congestion)., Disp: , Rfl:  .  SPRINTEC 28 0.25-35 MG-MCG tablet, Take 1 tablet by mouth daily., Disp: , Rfl:   EXAM:  VITALS per patient if applicable:  GENERAL: alert, oriented, appears well and in no acute distress  HEENT:  atraumatic, conjunttiva clear, no obvious abnormalities on inspection of external nose and ears, on video visit exam does have some tonsillar erythema, mild edema and white discharge  NECK: normal movements of the head and neck, denies LAD on patient self exam  LUNGS: on inspection no signs of respiratory distress, breathing rate appears normal, no obvious gross SOB, gasping or wheezing  CV: no obvious cyanosis  MS: moves all visible extremities without noticeable abnormality  PSYCH/NEURO: pleasant and cooperative, no obvious depression or anxiety, speech and thought processing grossly intact  ASSESSMENT AND PLAN:  Discussed the following assessment and plan:  Sore throat  -we discussed possible serious and likely etiologies, options for evaluation and workup, limitations of telemedicine visit vs in person visit, treatment, treatment risks and precautions. Pt prefers to treat via telemedicine empirically rather then risking or undertaking an in person visit at this moment. Discussed viral or strep pharyngitis most likely. Discussed other potential causes including but not limited to STI, COVID19. She feels strongly that this is strep and prefers to try empiric treatment with Amox 500mg  bid x 10 days prior to exam or testing. She agrees to stay home while sick and to seek prompt in person care if worsening, new symptoms arise, or if is not improving with treatment over the next 24-48 hours.   I discussed the assessment and treatment plan with the patient. The patient was provided an opportunity to ask questions and all were answered. The patient agreed with the plan and demonstrated an understanding of the  instructions.   The patient was advised to call back or seek an in-person evaluation if the symptoms worsen or if the condition fails to improve as anticipated.   Terressa Koyanagi, DO

## 2020-04-10 ENCOUNTER — Telehealth: Payer: Self-pay

## 2020-04-10 NOTE — Telephone Encounter (Signed)
Dr C please advise.  Pt is asking for Sprintec 28 day birth control.  This has never been given by you.  I see where it was talked about  12/15/2019 but she mentioned going to a OBGYN. I tried calling pt to verify an sent a my chart message as well to see if she made an appointment or changed her mind about birth control.

## 2020-04-11 ENCOUNTER — Other Ambulatory Visit: Payer: Self-pay

## 2020-04-11 NOTE — Telephone Encounter (Signed)
On review of my last OV note, pt was planning to schedule with GYN for nexplanon placement. Does she need referral? Or recommendation for GYN? If she no longer wants to proceed with nexplanon, I would be agreeable to prescribing this, but she needs appt for PAP (she's never had one) to get UTD.

## 2020-04-11 NOTE — Telephone Encounter (Signed)
Dr. Salena Saner this is just an FYI.  Pt is coming in tomorrow to discuss [facility], she said she would be willing to do a pap tomorrow 04/12/2020 at 8:30 with you. She did say she is due for her menstrual cycle either tonight or tomorrow for a FYI.

## 2020-04-12 ENCOUNTER — Encounter: Payer: Self-pay | Admitting: Family Medicine

## 2020-04-12 ENCOUNTER — Ambulatory Visit (INDEPENDENT_AMBULATORY_CARE_PROVIDER_SITE_OTHER): Payer: PRIVATE HEALTH INSURANCE | Admitting: Family Medicine

## 2020-04-12 ENCOUNTER — Other Ambulatory Visit (HOSPITAL_COMMUNITY)
Admission: RE | Admit: 2020-04-12 | Discharge: 2020-04-12 | Disposition: A | Discharge status: Home / Self Care | Payer: PRIVATE HEALTH INSURANCE | Source: Ambulatory Visit | Attending: Family Medicine | Admitting: Family Medicine

## 2020-04-12 VITALS — BP 120/78 | HR 73 | Temp 97.2°F | Ht 65.0 in | Wt 132.4 lb

## 2020-04-12 DIAGNOSIS — Z113 Encounter for screening for infections with a predominantly sexual mode of transmission: Secondary | ICD-10-CM | POA: Diagnosis not present

## 2020-04-12 DIAGNOSIS — Z124 Encounter for screening for malignant neoplasm of cervix: Secondary | ICD-10-CM | POA: Diagnosis not present

## 2020-04-12 DIAGNOSIS — Z3041 Encounter for surveillance of contraceptive pills: Secondary | ICD-10-CM | POA: Diagnosis not present

## 2020-04-12 DIAGNOSIS — Z01419 Encounter for gynecological examination (general) (routine) without abnormal findings: Secondary | ICD-10-CM

## 2020-04-12 MED ORDER — SPRINTEC 28 0.25-35 MG-MCG PO TABS: 1.0000 | ORAL_TABLET | Freq: Every day | ORAL | 3 refills | Status: DC

## 2020-04-12 NOTE — Progress Notes (Signed)
Traci Oconnell is a 29 y.o. female  Chief Complaint  Patient presents with  . Gynecologic Exam    pap and discuss birth control    HPI: Traci Oconnell is a 29 y.o. female seen today for PAP and CBE. She has never had a PAP. She also requests a refill of her OCP. In the past, we had discussed other forms of birth control, specifically nexplanon, and pt was going to establish with GYN to have this placed. She has chosen not to do so at this time and continue on OCPs. She would like STI screening.  FDLMP - 3 mo or so, pt skips placebo pills Sexually active - yes Birth control - OCP H/o STI - no Vaginal itching, odor, discharge - no; pt had yeast infection about 2 wks ago, resolved with OTC monistat Abnormal bleeding - no  Past Medical History:  Diagnosis Date  . Panic attack     Past Surgical History:  Procedure Laterality Date  . WISDOM TOOTH EXTRACTION  2010    Social History   Socioeconomic History  . Marital status: Single    Spouse name: Not on file  . Number of children: Not on file  . Years of education: Not on file  . Highest education level: Not on file  Occupational History  . Not on file  Tobacco Use  . Smoking status: Never Smoker  . Smokeless tobacco: Never Used  Vaping Use  . Vaping Use: Never used  Substance and Sexual Activity  . Alcohol use: Yes    Alcohol/week: 1.0 standard drink    Types: 1 Standard drinks or equivalent per week    Comment: socially  . Drug use: Yes    Types: Marijuana    Comment: per pt no longer smoking marijuana  . Sexual activity: Yes    Birth control/protection: Pill  Other Topics Concern  . Not on file  Social History Narrative  . Not on file   Social Determinants of Health   Financial Resource Strain:   . Difficulty of Paying Living Expenses: Not on file  Food Insecurity:   . Worried About Programme researcher, broadcasting/film/video in the Last Year: Not on file  . Ran Out of Food in the Last Year: Not on file   Transportation Needs:   . Lack of Transportation (Medical): Not on file  . Lack of Transportation (Non-Medical): Not on file  Physical Activity:   . Days of Exercise per Week: Not on file  . Minutes of Exercise per Session: Not on file  Stress:   . Feeling of Stress : Not on file  Social Connections:   . Frequency of Communication with Friends and Family: Not on file  . Frequency of Social Gatherings with Friends and Family: Not on file  . Attends Religious Services: Not on file  . Active Member of Clubs or Organizations: Not on file  . Attends Banker Meetings: Not on file  . Marital Status: Not on file  Intimate Partner Violence:   . Fear of Current or Ex-Partner: Not on file  . Emotionally Abused: Not on file  . Physically Abused: Not on file  . Sexually Abused: Not on file    Family History  Problem Relation Age of Onset  . Anxiety disorder Mother   . Cancer Father      Immunization History  Administered Date(s) Administered  . PFIZER SARS-COV-2 Vaccination 10/14/2019, 11/08/2019  . Tdap 06/29/2015    Outpatient Encounter Medications as  of 04/12/2020  Medication Sig  . SPRINTEC 28 0.25-35 MG-MCG tablet Take 1 tablet by mouth daily.  . [DISCONTINUED] acetaminophen (TYLENOL) 500 MG tablet Take 1,000 mg by mouth every 6 (six) hours as needed for moderate pain.  . [DISCONTINUED] DM-Phenylephrine-Acetaminophen (VICKS DAYQUIL MULTI-SYMPTOM) 10-5-325 MG/15ML LIQD Take 10 mLs by mouth 2 (two) times daily as needed (for cough and congestion).  . [DISCONTINUED] SPRINTEC 28 0.25-35 MG-MCG tablet Take 1 tablet by mouth daily.  . [DISCONTINUED] amoxicillin (AMOXIL) 500 MG capsule Take 1 capsule (500 mg total) by mouth 3 (three) times daily. (Patient not taking: Reported on 04/12/2020)   No facility-administered encounter medications on file as of 04/12/2020.     ROS: Pertinent positives and negatives noted in HPI. Remainder of ROS non-contributory    Allergies   Allergen Reactions  . Nickel Itching, Rash and Other (See Comments)    Causes bumps also    BP 120/78   Pulse 73   Temp (!) 97.2 F (36.2 C) (Temporal)   Ht 5\' 5"  (1.651 m)   Wt 132 lb 6.4 oz (60.1 kg)   SpO2 96%   BMI 22.03 kg/m   Physical Exam Exam conducted with a chaperone present.  Constitutional:      General: She is not in acute distress.    Appearance: Normal appearance. She is not ill-appearing.  Chest:     Breasts:        Right: Normal. No mass, skin change or tenderness.        Left: Normal. No mass, skin change or tenderness.  Genitourinary:    Labia:        Right: No rash, tenderness or lesion.        Left: No rash, tenderness or lesion.      Vagina: No vaginal discharge, erythema, tenderness, bleeding or lesions.     Cervix: No cervical motion tenderness, discharge, friability, erythema or cervical bleeding.     Uterus: Not deviated, not enlarged, not tender and no uterine prolapse.      Adnexa:        Right: No mass, tenderness or fullness.         Left: No mass, tenderness or fullness.    Lymphadenopathy:     Upper Body:     Right upper body: No axillary adenopathy.     Left upper body: No axillary adenopathy.  Neurological:     Mental Status: She is alert.  Psychiatric:        Mood and Affect: Mood normal.        Behavior: Behavior normal.      A/P:  1. Screening for cervical cancer - Cytology - PAP( [place])  2. Encounter for birth control pills maintenance Refill: - SPRINTEC 28 0.25-35 MG-MCG tablet; Take 1 tablet by mouth daily.  Dispense: 84 tablet; Refill: 3  3. Routine screening for STI (sexually transmitted infection) - Cytology - PAP( ) - HIV Antibody (routine testing w rflx) - Hepatitis B surface antigen - [facility]PR - Hepatitis C Antibody    This visit occurred during the SARS-CoV-2 public health emergency.  Safety protocols were in place, including screening questions prior to the visit, additional usage of staff  PPE, and extensive cleaning of exam room while observing appropriate contact time as indicated for disinfecting solutions.

## 2020-04-13 LAB — HEPATITIS C ANTIBODY
Hepatitis C Ab: NONREACTIVE
SIGNAL TO CUT-OFF: 0.02 (ref <1.00)

## 2020-04-13 LAB — HIV ANTIBODY (ROUTINE TESTING W REFLEX): HIV 1&2 Ab, 4th Generation: NONREACTIVE

## 2020-04-13 LAB — HEPATITIS B SURFACE ANTIGEN: Hepatitis B Surface Ag: NONREACTIVE

## 2020-04-13 LAB — RPR: RPR Ser Ql: NONREACTIVE

## 2020-04-14 ENCOUNTER — Encounter: Payer: Self-pay | Admitting: Family Medicine

## 2020-04-14 ENCOUNTER — Other Ambulatory Visit: Payer: Self-pay | Admitting: Family Medicine

## 2020-04-14 DIAGNOSIS — R87612 Low grade squamous intraepithelial lesion on cytologic smear of cervix (LGSIL): Secondary | ICD-10-CM

## 2020-04-14 LAB — CYTOLOGY - PAP
Adequacy: ABSENT
Chlamydia: NEGATIVE
Comment: NEGATIVE
Comment: NORMAL
Neisseria Gonorrhea: NEGATIVE

## 2020-04-14 NOTE — Progress Notes (Signed)
lsil

## 2020-04-22 ENCOUNTER — Telehealth (INDEPENDENT_AMBULATORY_CARE_PROVIDER_SITE_OTHER): Payer: PRIVATE HEALTH INSURANCE | Admitting: Family Medicine

## 2020-04-22 ENCOUNTER — Encounter: Payer: Self-pay | Admitting: Family Medicine

## 2020-04-22 ENCOUNTER — Other Ambulatory Visit: Payer: Self-pay

## 2020-04-22 DIAGNOSIS — J020 Streptococcal pharyngitis: Secondary | ICD-10-CM | POA: Insufficient documentation

## 2020-04-22 MED ORDER — AMOXICILLIN 500 MG PO CAPS: 500.0000 mg | ORAL_CAPSULE | Freq: Two times a day (BID) | ORAL | 0 refills | Status: DC

## 2020-04-22 NOTE — Assessment & Plan Note (Signed)
Suspect strep throat given symptoms and exposure.  Discussed treatment with amoxicillin.  Encouraged her to remain quarantined at home until she has the result of her Covid test on Monday though I have low suspicion for COVID-19 as a cause.  Discussed contacting her PCP if she is not improving.

## 2020-04-22 NOTE — Progress Notes (Signed)
Virtual Visit via video Note  This visit type was conducted due to national recommendations for restrictions regarding the COVID-19 pandemic (e.g. social distancing).  This format is felt to be most appropriate for this patient at this time.  All issues noted in this document were discussed and addressed.  No physical exam was performed (except for noted visual exam findings with Video Visits).   I connected with Traci Oconnell today at  9:00 AM EDT by a video enabled telemedicine application and verified that I am speaking with the correct person using two identifiers. Location patient: home Location provider: work Persons participating in the virtual visit: patient, provider  I discussed the limitations, risks, security and privacy concerns of performing an evaluation and management service by telephone and the availability of in person appointments. I also discussed with the patient that there may be a patient responsible charge related to this service. The patient expressed understanding and agreed to proceed.  Reason for visit: same day visit  HPI: Sore throat: Patient notes onset of sore throat on 04/20/2020.  Has developed white patches in the back of her throat.  No fevers.  T-max 99.1 F.  No congestion or cough.  No COVID-19 exposure.  She does note she was around somebody earlier in the week who tested positive for strep throat.  She does note she has a Covid test scheduled for Monday just to be safe.  She is fully vaccinated against COVID-19.   ROS: See pertinent positives and negatives per HPI.  Past Medical History:  Diagnosis Date  . Panic attack     Past Surgical History:  Procedure Laterality Date  . WISDOM TOOTH EXTRACTION  2010    Family History  Problem Relation Age of Onset  . Anxiety disorder Mother   . Cancer Father     SOCIAL HX: nonsmoker   Current Outpatient Medications:  .  amoxicillin (AMOXIL) 500 MG capsule, Take 1 capsule (500 mg total) by  mouth 2 (two) times daily., Disp: 20 capsule, Rfl: 0 .  SPRINTEC 28 0.25-35 MG-MCG tablet, Take 1 tablet by mouth daily., Disp: 84 tablet, Rfl: 3  EXAM:  VITALS per patient if applicable:  GENERAL: alert, oriented, appears well and in no acute distress  HEENT: atraumatic, conjunttiva clear, no obvious abnormalities on inspection of external nose and ears, erythematous throat with small white spots noted  NECK: normal movements of the head and neck  LUNGS: on inspection no signs of respiratory distress, breathing rate appears normal, no obvious gross SOB, gasping or wheezing  CV: no obvious cyanosis  ASSESSMENT AND PLAN:  Discussed the following assessment and plan:  Strep throat Suspect strep throat given symptoms and exposure.  Discussed treatment with amoxicillin.  Encouraged her to remain quarantined at home until she has the result of her Covid test on Monday though I have low suspicion for COVID-19 as a cause.  Discussed contacting her PCP if she is not improving.   No orders of the defined types were placed in this encounter.   Meds ordered this encounter  Medications  . amoxicillin (AMOXIL) 500 MG capsule    Sig: Take 1 capsule (500 mg total) by mouth 2 (two) times daily.    Dispense:  20 capsule    Refill:  0     I discussed the assessment and treatment plan with the patient. The patient was provided an opportunity to ask questions and all were answered. The patient agreed with the plan and demonstrated an  understanding of the instructions.   The patient was advised to call back or seek an in-person evaluation if the symptoms worsen or if the condition fails to improve as anticipated.     Marikay Alar, MD

## 2020-05-02 ENCOUNTER — Ambulatory Visit (INDEPENDENT_AMBULATORY_CARE_PROVIDER_SITE_OTHER): Payer: PRIVATE HEALTH INSURANCE | Admitting: Obstetrics and Gynecology

## 2020-05-02 ENCOUNTER — Encounter: Payer: Self-pay | Admitting: Obstetrics and Gynecology

## 2020-05-02 ENCOUNTER — Other Ambulatory Visit: Payer: Self-pay

## 2020-05-02 VITALS — BP 120/74 | Ht 64.0 in | Wt 131.0 lb

## 2020-05-02 DIAGNOSIS — R87612 Low grade squamous intraepithelial lesion on cytologic smear of cervix (LGSIL): Secondary | ICD-10-CM

## 2020-05-02 NOTE — Progress Notes (Signed)
GGA Note  Patient here for abnormal Pap smear LGSIL result.  This was her first Pap smear.  We discussed colposcopy examination as the next step and the role of HPV leading to cervical changes that can result in an abnormal Pap smear.  We discussed the potential for associated pain and cramping after the procedure.  She did not know that she would be having the procedure today so she would like to prepare for it a little bit better, so she is welcome to return another day when not on her menstrual period to do the colposcopy.  I encouraged her to schedule this within the next few weeks.  No charge for today's encounter.  Theresia Majors, MD

## 2020-10-09 ENCOUNTER — Other Ambulatory Visit: Payer: Self-pay

## 2020-10-09 DIAGNOSIS — Z3041 Encounter for surveillance of contraceptive pills: Secondary | ICD-10-CM

## 2020-10-09 MED ORDER — SPRINTEC 28 0.25-35 MG-MCG PO TABS
1.0000 | ORAL_TABLET | Freq: Every day | ORAL | 3 refills | Status: DC
Start: 2020-10-09 — End: 2021-06-20

## 2020-10-09 NOTE — Telephone Encounter (Signed)
Last ov 04/12/20 Last fill 04/12/20  #84/3

## 2021-01-16 ENCOUNTER — Emergency Department (HOSPITAL_COMMUNITY): Payer: No Typology Code available for payment source

## 2021-01-16 ENCOUNTER — Emergency Department (HOSPITAL_COMMUNITY)
Admission: EM | Admit: 2021-01-16 | Discharge: 2021-01-16 | Disposition: A | Discharge status: Home / Self Care | Payer: No Typology Code available for payment source | Attending: Emergency Medicine | Admitting: Emergency Medicine

## 2021-01-16 ENCOUNTER — Other Ambulatory Visit: Payer: Self-pay

## 2021-01-16 ENCOUNTER — Encounter (HOSPITAL_COMMUNITY): Payer: Self-pay

## 2021-01-16 DIAGNOSIS — M546 Pain in thoracic spine: Secondary | ICD-10-CM | POA: Diagnosis not present

## 2021-01-16 DIAGNOSIS — R0789 Other chest pain: Secondary | ICD-10-CM | POA: Diagnosis not present

## 2021-01-16 DIAGNOSIS — R079 Chest pain, unspecified: Secondary | ICD-10-CM | POA: Diagnosis present

## 2021-01-16 LAB — CBC
HCT: 41.7 % (ref 36.0–46.0)
Hemoglobin: 13.7 g/dL (ref 12.0–15.0)
MCH: 29.4 pg (ref 26.0–34.0)
MCHC: 32.9 g/dL (ref 30.0–36.0)
MCV: 89.5 fL (ref 80.0–100.0)
Platelets: 372 10*3/uL (ref 150–400)
RBC: 4.66 MIL/uL (ref 3.87–5.11)
RDW: 12.3 % (ref 11.5–15.5)
WBC: 13.5 10*3/uL — ABNORMAL HIGH (ref 4.0–10.5)
nRBC: 0 % (ref 0.0–0.2)

## 2021-01-16 LAB — D-DIMER, QUANTITATIVE: D-Dimer, Quant: 0.52 ug/mL-FEU — ABNORMAL HIGH (ref 0.00–0.50)

## 2021-01-16 LAB — COMPREHENSIVE METABOLIC PANEL
ALT: 12 U/L (ref 0–44)
AST: 21 U/L (ref 15–41)
Albumin: 4.6 g/dL (ref 3.5–5.0)
Alkaline Phosphatase: 51 U/L (ref 38–126)
Anion gap: 10 (ref 5–15)
BUN: 11 mg/dL (ref 6–20)
CO2: 24 mmol/L (ref 22–32)
Calcium: 9.5 mg/dL (ref 8.9–10.3)
Chloride: 103 mmol/L (ref 98–111)
Creatinine, Ser: 0.63 mg/dL (ref 0.44–1.00)
GFR, Estimated: >60 mL/min (ref >60)
Glucose, Bld: 84 mg/dL (ref 70–99)
Potassium: 4.4 mmol/L (ref 3.5–5.1)
Sodium: 137 mmol/L (ref 135–145)
Total Bilirubin: 0.4 mg/dL (ref 0.3–1.2)
Total Protein: 8.7 g/dL — ABNORMAL HIGH (ref 6.5–8.1)

## 2021-01-16 LAB — I-STAT BETA HCG BLOOD, ED (MC, WL, AP ONLY): I-stat hCG, quantitative: <5 m[IU]/mL (ref <5)

## 2021-01-16 LAB — TROPONIN I (HIGH SENSITIVITY)
Troponin I (High Sensitivity): <2 ng/L (ref <18)
Troponin I (High Sensitivity): <2 ng/L (ref <18)

## 2021-01-16 LAB — SEDIMENTATION RATE: Sed Rate: 22 mm/hr (ref 0–22)

## 2021-01-16 LAB — LIPASE, BLOOD: Lipase: 28 U/L (ref 11–51)

## 2021-01-16 LAB — C-REACTIVE PROTEIN: CRP: 1.1 mg/dL — ABNORMAL HIGH (ref <1.0)

## 2021-01-16 MED ORDER — SUCRALFATE 1 G PO TABS: 1.0000 g | ORAL_TABLET | Freq: Three times a day (TID) | ORAL | 0 refills | Status: DC

## 2021-01-16 MED ORDER — ONDANSETRON HCL 4 MG/2ML IJ SOLN
4.0000 mg | Freq: Once | INTRAMUSCULAR | Status: AC
Start: 2021-01-16 — End: 2021-01-16
  Administered 2021-01-16: 4 mg via INTRAVENOUS
  Filled 2021-01-16: qty 2

## 2021-01-16 MED ORDER — IOHEXOL 350 MG/ML SOLN
100.0000 mL | Freq: Once | INTRAVENOUS | Status: AC | PRN
  Administered 2021-01-16: 100 mL via INTRAVENOUS

## 2021-01-16 MED ORDER — MORPHINE SULFATE (PF) 4 MG/ML IV SOLN
4.0000 mg | Freq: Once | INTRAVENOUS | Status: AC
  Administered 2021-01-16: 4 mg via INTRAVENOUS
  Filled 2021-01-16: qty 1

## 2021-01-16 MED ORDER — SODIUM CHLORIDE (PF) 0.9 % IJ SOLN
INTRAMUSCULAR | Status: AC
  Filled 2021-01-16: qty 50

## 2021-01-16 MED ORDER — FAMOTIDINE 20 MG PO TABS: 20.0000 mg | ORAL_TABLET | Freq: Two times a day (BID) | ORAL | 0 refills | Status: DC

## 2021-01-16 NOTE — ED Triage Notes (Signed)
Patient c/o mid back, chest, and upper abdominal pain since yesterday at 1130. Patient states she had N/V yesterday and this morning. Patient states she has had intermittent numbness and tingling in her hands since lst night.

## 2021-01-16 NOTE — Discharge Instructions (Addendum)
As discussed, your evaluation today has been largely reassuring.  But, it is important that you monitor your condition carefully, and do not hesitate to return to the ED if you develop new, or concerning changes in your condition. ? ?Otherwise, please follow-up with your physician for appropriate ongoing care. ? ?

## 2021-01-16 NOTE — ED Provider Notes (Signed)
[facility] DEPT Provider Note   CSN: [id number] Arrival date & time: 01/16/21  1054     History Chief Complaint  Patient presents with   Abdominal Pain   Back Pain   Chest Pain    Traci Oconnell is a 30 y.o. female.  HPI Patient presents with thoracic pain. She notes that she is generally well, takes no medication regularly.  She does not smoke, drinks socially.  She received 3 COVID vaccines.  Last night, about 12 hours ago she developed pain in the mid thoracic area.  This point the pain began to radiate and is now inframammary bilaterally, tight, diffuse throughout the thorax with associated dyspnea, nausea.  No relief with anything.  No vomiting, no abdominal pain, no syncope.  No history of surgery.    Past Medical History:  Diagnosis Date   Panic attack     Patient Active Problem List   Diagnosis Date Noted   Strep throat 04/22/2020    Past Surgical History:  Procedure Laterality Date   WISDOM TOOTH EXTRACTION  2010     OB History     Gravida  0   Para  0   Term  0   Preterm  0   AB  0   Living  0      SAB  0   IAB  0   Ectopic  0   Multiple  0   Live Births  0           Family History  Problem Relation Age of Onset   Anxiety disorder Mother    Cancer Father        Skin    Social History   Tobacco Use   Smoking status: Never   Smokeless tobacco: Never  Vaping Use   Vaping Use: Never used  Substance Use Topics   Alcohol use: Yes    Alcohol/week: 1.0 standard drink    Types: 1 Standard drinks or equivalent per week    Comment: socially   Drug use: Not Currently    Types: Marijuana    Comment: per pt no longer smoking marijuana    Home Medications Prior to Admission medications   Medication Sig Start Date End Date Taking? Authorizing Provider  [place] 28 0.25-35 MG-MCG tablet Take 1 tablet by mouth daily. Patient skips the placebo. 10/09/20   CiriglianoGarvin Fila, DO    Allergies     Nickel  Review of Systems   Review of Systems  Constitutional:        Per HPI, otherwise negative  HENT:         Per HPI, otherwise negative  Respiratory:         Per HPI, otherwise negative  Cardiovascular:        Per HPI, otherwise negative  Gastrointestinal:  Negative for vomiting.  Endocrine:       Negative aside from HPI  Genitourinary:        Neg aside from HPI   Musculoskeletal:        Per HPI, otherwise negative  Skin: Negative.   Neurological:  Negative for syncope.   Physical Exam Updated Vital Signs BP 118/79   Pulse 91   Temp 97.8 F (36.6 C) (Oral)   Resp (!) 22   Ht $R'5\' 4"'dK$  (1.626 m)   Wt 59 kg   LMP 01/11/2021   SpO2 97%   BMI 22.31 kg/m   Physical Exam Vitals and nursing note reviewed.  Constitutional:      General: She is not in acute distress.    Appearance: She is well-developed.  HENT:     Head: Normocephalic and atraumatic.  Eyes:     Conjunctiva/sclera: Conjunctivae normal.  Cardiovascular:     Rate and Rhythm: Normal rate and regular rhythm.  Pulmonary:     Effort: Pulmonary effort is normal. No respiratory distress.     Breath sounds: Normal breath sounds. No stridor.  Abdominal:     General: There is no distension.  Musculoskeletal:       Arms:  Skin:    General: Skin is warm and dry.  Neurological:     Mental Status: She is alert and oriented to person, place, and time.     Cranial Nerves: No cranial nerve deficit.    ED Results / Procedures / Treatments   Labs (all labs ordered are listed, but only abnormal results are displayed) Labs Reviewed  CBC - Abnormal; Notable for the following components:      Result Value   WBC 13.5 (*)    All other components within normal limits  C-REACTIVE PROTEIN - Abnormal; Notable for the following components:   CRP 1.1 (*)    All other components within normal limits  D-DIMER, QUANTITATIVE - Abnormal; Notable for the following components:   D-Dimer, Quant 0.52 (*)    All other  components within normal limits  COMPREHENSIVE METABOLIC PANEL - Abnormal; Notable for the following components:   Total Protein 8.7 (*)    All other components within normal limits  SEDIMENTATION RATE  LIPASE, BLOOD  I-STAT BETA HCG BLOOD, ED ([facility], [facility], [facility] ONLY)  TROPONIN I (HIGH SENSITIVITY)  TROPONIN I (HIGH SENSITIVITY)    EKG EKG Interpretation  Date/Time:  Tuesday January 16 2021 12:25:54 EDT Ventricular Rate:  82 PR Interval:  136 QRS Duration: 77 QT Interval:  360 QTC Calculation: 421 R Axis:   89 Text Interpretation: Sinus rhythm Right atrial enlargement Baseline wander in lead(s) II 12 Lead; Mason-Likar Abnormal ECG Confirmed by Carmin Muskrat [phone]) on 01/16/2021 12:30:22 PM  Radiology CT Angio Chest PE W/Cm &/Or Wo Cm  Result Date: 01/16/2021 CLINICAL DATA:  Patient c/o mid back, chest, and upper abdominal pain since yesterday at 1130. Patient states she had N/V yesterday and this morning. Patient states she has had intermittent numbness and tingling in her hands since lst night. EXAM: CT ANGIOGRAPHY CHEST WITH CONTRAST TECHNIQUE: Multidetector CT imaging of the chest was performed using the standard protocol during bolus administration of intravenous contrast. Multiplanar CT image reconstructions and MIPs were obtained to evaluate the vascular anatomy. CONTRAST:  143mL OMNIPAQUE IOHEXOL 350 MG/ML SOLN COMPARISON:  None. FINDINGS: Cardiovascular: Satisfactory opacification of the pulmonary arteries to the segmental level. No evidence of pulmonary embolism. Normal heart size. No pericardial effusion. Mediastinum/Nodes: No enlarged mediastinal, hilar, or axillary lymph nodes. Thyroid gland, trachea, and esophagus demonstrate no significant findings. Lungs/Pleura: Central airways are patent. Lungs are clear. No pleural effusion or pneumothorax. Upper Abdomen: No acute abnormality. Musculoskeletal: No chest wall abnormality. No acute or significant osseous findings. Review of the MIP  images confirms the above findings. IMPRESSION: Negative for pulmonary embolus or other acute intrathoracic process. Electronically Signed   By: Audie Pinto M.D.   On: 01/16/2021 16:27   DG Chest Port 1 View  Result Date: 01/16/2021 CLINICAL DATA:  Chest pain. EXAM: PORTABLE CHEST 1 VIEW COMPARISON:  September 16, 2015. FINDINGS: The heart size and mediastinal contours are within  normal limits. Both lungs are clear. No visible pleural effusions or pneumothorax. No acute osseous abnormality. IMPRESSION: No active disease. Electronically Signed   By: Margaretha Sheffield MD   On: 01/16/2021 12:38    Procedures Procedures   Medications Ordered in ED Medications  sodium chloride (PF) 0.9 % injection (has no administration in time range)  ondansetron (ZOFRAN) injection 4 mg (4 mg Intravenous Given 01/16/21 1449)  morphine 4 MG/ML injection 4 mg (4 mg Intravenous Given 01/16/21 1450)  iohexol (OMNIPAQUE) 350 MG/ML injection 100 mL (100 mLs Intravenous Contrast Given 01/16/21 1606)    ED Course  I have reviewed the triage vital signs and the nursing notes.  Pertinent labs & imaging results that were available during my care of the patient were reviewed by me and considered in my medical decision making (see chart for details).  Update: D-dimer positive.  4:54 PM Patient in no distress, resting comfortably.  She is awake, alert, speaking clearly.  We discussed all findings including reassuring x-ray, chest CT which I have reviewed myself. EKG, chest x-ray, CT, all reassuring, no evidence for atypical ACS, pneumothorax, pneumonia or other acute phenomena. Patient is calm.  Discussed possibilities for pain including esophageal spasm versus other musculoskeletal etiology.  Patient appropriate for discharge with close outpatient follow-up. MDM Rules/Calculators/A&P MDM Number of Diagnoses or Management Options Atypical chest pain: new, needed workup   Amount and/or Complexity of Data  Reviewed Clinical lab tests: ordered and reviewed Tests in the radiology section of CPT: ordered and reviewed Tests in the medicine section of CPT: reviewed and ordered Decide to obtain previous medical records or to obtain history from someone other than the patient: yes Review and summarize past medical records: yes Independent visualization of images, tracings, or specimens: yes  Risk of Complications, Morbidity, and/or Mortality Presenting problems: high Diagnostic procedures: high Management options: high  Critical Care Total time providing critical care: < 30 minutes  Patient Progress Patient progress: improved   Final Clinical Impression(s) / ED Diagnoses Final diagnoses:  Atypical chest pain       Carmin Muskrat, MD 01/16/21 1657

## 2021-01-26 ENCOUNTER — Encounter: Payer: Self-pay | Admitting: Family Medicine

## 2021-01-26 ENCOUNTER — Other Ambulatory Visit: Payer: Self-pay

## 2021-01-26 ENCOUNTER — Ambulatory Visit: Payer: No Typology Code available for payment source | Admitting: Family Medicine

## 2021-01-26 VITALS — BP 110/60 | HR 71 | Temp 98.3°F | Wt 131.0 lb

## 2021-01-26 DIAGNOSIS — R87612 Low grade squamous intraepithelial lesion on cytologic smear of cervix (LGSIL): Secondary | ICD-10-CM | POA: Diagnosis not present

## 2021-01-26 DIAGNOSIS — R0789 Other chest pain: Secondary | ICD-10-CM | POA: Diagnosis not present

## 2021-01-26 NOTE — Patient Instructions (Signed)
[facility]  [email or web address] [address], [address] [place] [address] P: [phone] F: [phone] [email or web address] Dr. Belva Agee, Dr. Mitchel Honour   [facility] [email or web address] [address] [place], [place] [address] fax: [phone] [email or web address]

## 2021-01-26 NOTE — Progress Notes (Signed)
Traci Oconnell is a 30 y.o. female  Chief Complaint  Patient presents with   Follow-up    Hospital f/u for chest pain, pat states she also would like to change birth control.    HPI: Traci Oconnell is a 30 y.o. female patient who is seen today to f/u on ER visit from 01/16/21. She was evaluated with thoracic back pain and chest pain. CTA chest was done d/t positive d dimer and CTA was negative for PE or any other acute process. Remainder of labs, EKG were WNL. Diagnosis at discharge was esophageal spasm vs MSK etiology. She was d/c'd home with pepcid 20mg  BID and carafate 1gm 4x/day. Today pt reports that she stopped taking meds after a few days. All symptoms resolved.   She would like referral to GYN for nexplanon placement. She also had abnormal PAP and was referred for colposcopy. She went to that appt but did not have colpo done d/t being anxious about the procedure once it was described to her.    Past Medical History:  Diagnosis Date   Panic attack     Past Surgical History:  Procedure Laterality Date   WISDOM TOOTH EXTRACTION  2010    Social History   Socioeconomic History   Marital status: Single    Spouse name: Not on file   Number of children: Not on file   Years of education: Not on file   Highest education level: Not on file  Occupational History   Not on file  Tobacco Use   Smoking status: Never   Smokeless tobacco: Never  Vaping Use   Vaping Use: Never used  Substance and Sexual Activity   Alcohol use: Yes    Alcohol/week: 1.0 standard drink    Types: 1 Standard drinks or equivalent per week    Comment: socially   Drug use: Not Currently    Types: Marijuana    Comment: per pt no longer smoking marijuana   Sexual activity: Yes    Birth control/protection: Pill    Comment: 1st intercourse 30 yo-More than 5 partners  Other Topics Concern   Not on file  Social History Narrative   Not on file   Social Determinants of Health   Financial  Resource Strain: Not on file  Food Insecurity: Not on file  Transportation Needs: Not on file  Physical Activity: Not on file  Stress: Not on file  Social Connections: Not on file  Intimate Partner Violence: Not on file    Family History  Problem Relation Age of Onset   Anxiety disorder Mother    Cancer Father        Skin     Immunization History  Administered Date(s) Administered   PFIZER(Purple Top)SARS-COV-2 Vaccination 10/14/2019, 11/08/2019   Tdap 06/29/2015    Outpatient Encounter Medications as of 01/26/2021  Medication Sig   SPRINTEC 28 0.25-35 MG-MCG tablet Take 1 tablet by mouth daily. Patient skips the placebo.   famotidine (PEPCID) 20 MG tablet Take 1 tablet (20 mg total) by mouth 2 (two) times daily. (Patient not taking: Reported on 01/26/2021)   sucralfate (CARAFATE) 1 g tablet Take 1 tablet (1 g total) by mouth 4 (four) times daily -  with meals and at bedtime. (Patient not taking: Reported on 01/26/2021)   No facility-administered encounter medications on file as of 01/26/2021.     ROS: Pertinent positives and negatives noted in HPI. Remainder of ROS non-contributory    Allergies  Allergen Reactions   Nickel Itching,  Rash and Other (See Comments)    Causes bumps also    BP 110/60 (BP Location: Left Arm, Patient Position: Sitting, Cuff Size: Normal)   Pulse 71   Temp 98.3 F (36.8 C) (Temporal)   Wt 131 lb (59.4 kg)   LMP 01/11/2021   SpO2 98%   BMI 22.49 kg/m  Wt Readings from Last 3 Encounters:  01/26/21 131 lb (59.4 kg)  01/16/21 130 lb (59 kg)  05/02/20 131 lb (59.4 kg)   Temp Readings from Last 3 Encounters:  01/26/21 98.3 F (36.8 C) (Temporal)  01/16/21 97.8 F (36.6 C) (Oral)  04/12/20 (!) 97.2 F (36.2 C) (Temporal)   BP Readings from Last 3 Encounters:  01/26/21 110/60  01/16/21 118/79  05/02/20 120/74   Pulse Readings from Last 3 Encounters:  01/26/21 71  01/16/21 91  04/12/20 73     Physical Exam Constitutional:       General: She is not in acute distress.    Appearance: She is normal weight. She is not ill-appearing.  Pulmonary:     Effort: No respiratory distress.  Neurological:     Mental Status: She is alert and oriented to person, place, and time.  Psychiatric:        Mood and Affect: Mood normal.        Behavior: Behavior normal.     A/P:  1. Low grade squamous intraepith lesion on cytologic smear cervix (lgsil) - Ambulatory referral to Obstetrics / Gynecology  2. Atypical chest pain - resolved, likely GI in nature - no further f/u or meds needed   This visit occurred during the SARS-CoV-2 public health emergency.  Safety protocols were in place, including screening questions prior to the visit, additional usage of staff PPE, and extensive cleaning of exam room while observing appropriate contact time as indicated for disinfecting solutions.

## 2021-06-19 ENCOUNTER — Other Ambulatory Visit: Payer: Self-pay

## 2021-06-20 ENCOUNTER — Ambulatory Visit: Payer: No Typology Code available for payment source | Admitting: Nurse Practitioner

## 2021-06-20 ENCOUNTER — Other Ambulatory Visit (HOSPITAL_COMMUNITY)
Admission: RE | Admit: 2021-06-20 | Discharge: 2021-06-20 | Disposition: A | Discharge status: Home / Self Care | Payer: No Typology Code available for payment source | Source: Ambulatory Visit | Attending: Nurse Practitioner | Admitting: Nurse Practitioner

## 2021-06-20 ENCOUNTER — Encounter: Payer: Self-pay | Admitting: Nurse Practitioner

## 2021-06-20 VITALS — BP 104/60 | HR 80 | Temp 97.3°F | Wt 125.4 lb

## 2021-06-20 DIAGNOSIS — Z3041 Encounter for surveillance of contraceptive pills: Secondary | ICD-10-CM | POA: Diagnosis not present

## 2021-06-20 DIAGNOSIS — Z113 Encounter for screening for infections with a predominantly sexual mode of transmission: Secondary | ICD-10-CM

## 2021-06-20 DIAGNOSIS — Z309 Encounter for contraceptive management, unspecified: Secondary | ICD-10-CM | POA: Insufficient documentation

## 2021-06-20 MED ORDER — SPRINTEC 28 0.25-35 MG-MCG PO TABS: 1.0000 | ORAL_TABLET | Freq: Every day | ORAL | 3 refills | Status: DC

## 2021-06-20 NOTE — Progress Notes (Signed)
   Subjective:  Patient ID: Traci Oconnell, female    DOB: 27-Apr-1991  Age: 30 y.o. MRN: [id number]  CC: Establish Care (TOC-Dr. C/Pt in need of birth control refill (Sprintec). /No vaccines today)  HPI Transfer from Dr. Barron Alvine  Contraception management Denies any adverse effects with sprintec, she skips placebo pills. Plans to schedule appt with GYN for repeat PAP. Hx of abnormal PAP. She is also requesting for STD screen today. No symptoms. No tobacco use No hx of PE/DVT  COC refill sent.  Reviewed past Medical, Social and Family history today.  Outpatient Medications Prior to Visit  Medication Sig Dispense Refill   FLUoxetine (PROZAC) 20 MG capsule Take 20 mg by mouth daily.     hydrOXYzine (ATARAX/VISTARIL) 25 MG tablet Take 25 mg by mouth 2 (two) times daily as needed.     SPRINTEC 28 0.25-35 MG-MCG tablet Take 1 tablet by mouth daily. Patient skips the placebo. 84 tablet 3   No facility-administered medications prior to visit.    ROS See HPI  Objective:  BP 104/60 (BP Location: Left Arm, Patient Position: Sitting, Cuff Size: Normal)   Pulse 80   Temp (!) 97.3 F (36.3 C) (Temporal)   Wt 125 lb 6.4 oz (56.9 kg)   SpO2 98%   BMI 21.52 kg/m   Physical Exam Cardiovascular:     Rate and Rhythm: Normal rate.     Pulses: Normal pulses.  Pulmonary:     Effort: Pulmonary effort is normal.  Neurological:     Mental Status: She is alert and oriented to person, place, and time.  Psychiatric:        Mood and Affect: Mood normal.        Behavior: Behavior normal.        Thought Content: Thought content normal.   Assessment & Plan:  This visit occurred during the SARS-CoV-2 public health emergency.  Safety protocols were in place, including screening questions prior to the visit, additional usage of staff PPE, and extensive cleaning of exam room while observing appropriate contact time as indicated for disinfecting solutions.   Traci Oconnell was seen today for  establish care.  Diagnoses and all orders for this visit:  Encounter for surveillance of contraceptive pills -     SPRINTEC 28 0.25-35 MG-MCG tablet; Take 1 tablet by mouth daily. Patient skips the placebo.  Screen for STD (sexually transmitted disease) -     Urine cytology ancillary only([place]) -     HIV antibody (with reflex) -     RPR -     Hepatitis C Antibody   Problem List Items Addressed This Visit       Other   Contraception management - Primary    Denies any adverse effects with sprintec, she skips placebo pills. Plans to schedule appt with GYN for repeat PAP. Hx of abnormal PAP. She is also requesting for STD screen today. No symptoms. No tobacco use No hx of PE/DVT  COC refill sent.      Relevant Medications   SPRINTEC 28 0.25-35 MG-MCG tablet   Other Visit Diagnoses     Screen for STD (sexually transmitted disease)       Relevant Orders   Urine cytology ancillary only([place])   HIV antibody (with reflex)   RPR   Hepatitis C Antibody       Follow-up: Return in about 6 months (around 12/18/2021) for CPE (fasting).  Alysia Penna, NP

## 2021-06-20 NOTE — Patient Instructions (Signed)
Go to lab for blood draw and urin collection.  Schedule appt with GYN for repeat PAP and/or colposcopy.

## 2021-06-20 NOTE — Assessment & Plan Note (Signed)
Denies any adverse effects with sprintec, she skips placebo pills. Plans to schedule appt with GYN for repeat PAP. Hx of abnormal PAP. She is also requesting for STD screen today. No symptoms. No tobacco use No hx of PE/DVT  COC refill sent.

## 2021-06-22 LAB — HIV ANTIBODY (ROUTINE TESTING W REFLEX): HIV 1&2 Ab, 4th Generation: NONREACTIVE

## 2021-06-22 LAB — RPR: RPR Ser Ql: NONREACTIVE

## 2021-06-22 LAB — URINE CYTOLOGY ANCILLARY ONLY
Chlamydia: NEGATIVE
Comment: NEGATIVE
Comment: NEGATIVE
Comment: NORMAL
Neisseria Gonorrhea: NEGATIVE
Trichomonas: NEGATIVE

## 2021-06-22 LAB — HEPATITIS C ANTIBODY
Hepatitis C Ab: NONREACTIVE
SIGNAL TO CUT-OFF: 0.03 (ref <1.00)

## 2021-08-16 ENCOUNTER — Telehealth: Payer: Self-pay | Admitting: Nurse Practitioner

## 2021-08-16 NOTE — Telephone Encounter (Signed)
Spoke to patient, advised that RX was sent with a qty#84, 3 rfs, although pharmacy is dispensing it to her at a month at a time.  She will call them to check into this. Dm/cma

## 2021-12-18 ENCOUNTER — Encounter: Payer: No Typology Code available for payment source | Admitting: Nurse Practitioner

## 2022-05-02 ENCOUNTER — Other Ambulatory Visit: Payer: Self-pay | Admitting: Nurse Practitioner

## 2022-05-02 DIAGNOSIS — Z3041 Encounter for surveillance of contraceptive pills: Secondary | ICD-10-CM

## 2022-05-30 ENCOUNTER — Other Ambulatory Visit: Payer: Self-pay | Admitting: Nurse Practitioner

## 2022-05-30 DIAGNOSIS — Z3041 Encounter for surveillance of contraceptive pills: Secondary | ICD-10-CM

## 2022-05-30 NOTE — Telephone Encounter (Signed)
Chart supports Rx Last OV: 05/2021 Next OV: not scheduled

## 2022-06-05 ENCOUNTER — Telehealth: Payer: Self-pay | Admitting: Nurse Practitioner

## 2022-06-05 DIAGNOSIS — Z3041 Encounter for surveillance of contraceptive pills: Secondary | ICD-10-CM

## 2022-06-05 MED ORDER — SPRINTEC 28 0.25-35 MG-MCG PO TABS: 1.0000 | ORAL_TABLET | Freq: Every day | ORAL | 0 refills | Status: DC

## 2022-06-05 NOTE — Telephone Encounter (Signed)
Caller Name: Roderick Sweezy Call back phone #: [phone]  Reason for Call: Pt hoping to get 3 months of BC. She had an issue with her insurance but it should be fixed in January. Preferred pharmacy is  [id number] [address] [address], [place] - [address]   Phone: [phone]  Fax: [phone]

## 2022-06-05 NOTE — Telephone Encounter (Signed)
Pt advised.

## 2022-08-22 ENCOUNTER — Other Ambulatory Visit: Payer: Self-pay | Admitting: Nurse Practitioner

## 2022-08-22 DIAGNOSIS — Z3041 Encounter for surveillance of contraceptive pills: Secondary | ICD-10-CM

## 2022-09-05 ENCOUNTER — Encounter: Payer: Self-pay | Admitting: Nurse Practitioner

## 2022-09-05 ENCOUNTER — Ambulatory Visit (INDEPENDENT_AMBULATORY_CARE_PROVIDER_SITE_OTHER): Payer: Self-pay | Admitting: Nurse Practitioner

## 2022-09-05 VITALS — BP 100/80 | HR 72 | Temp 98.7°F | Resp 16 | Ht 64.0 in | Wt 136.0 lb

## 2022-09-05 DIAGNOSIS — F339 Major depressive disorder, recurrent, unspecified: Secondary | ICD-10-CM

## 2022-09-05 DIAGNOSIS — Z3041 Encounter for surveillance of contraceptive pills: Secondary | ICD-10-CM

## 2022-09-05 LAB — POCT URINE PREGNANCY: Preg Test, Ur: NEGATIVE

## 2022-09-05 MED ORDER — SPRINTEC 28 0.25-35 MG-MCG PO TABS: 1.0000 | ORAL_TABLET | Freq: Every day | ORAL | 0 refills | Status: AC

## 2022-09-05 NOTE — Progress Notes (Signed)
Established Patient Visit  Patient: Traci Oconnell   DOB: 1991/07/15   32 y.o. Female  MRN: [id number] Visit Date: 09/05/2022  Subjective:    Chief Complaint  Patient presents with   Office visit    Birth Control refill    HPI Contraception management LMP 08/10/2022, sexually active, denies need for STD screen. Hx of abnormal PAP-LSIL 2021, she did not schedule appt with GYN as previously discussed. States she is unable to complete PAP today due to cost-self pay. Reports she does complete self breast exam monthly, denies any concerns Advised to contact [place] [facility] for pelvic exam. Negative urine pregnancy today Advised only 90days of contraception will be provided today till PAP smear is completed. She verbalized understanding.  Depression, recurrent ([place]) Followed by [facility] with [facility]. Current use of fluoxetine and vistaril   Reviewed medical, surgical, and social history today  Medications: Outpatient Medications Prior to Visit  Medication Sig   FLUoxetine (PROZAC) 20 MG capsule Take 20 mg by mouth daily.   hydrOXYzine (ATARAX/VISTARIL) 25 MG tablet Take 25 mg by mouth 2 (two) times daily as needed.   [DISCONTINUED] SPRINTEC 28 0.25-35 MG-MCG tablet Take 1 tablet by mouth daily. Patient skips the placebo. Need office visit for additional refills   No facility-administered medications prior to visit.   Reviewed past medical and social history.   ROS per HPI above      Objective:  BP 100/80 (BP Location: Right Arm, Patient Position: Sitting, Cuff Size: Normal)   Pulse 72   Temp 98.7 F (37.1 C) (Temporal)   Resp 16   Ht 5\' 4"  (1.626 m)   Wt 136 lb (61.7 kg)   LMP 08/10/2022 (Exact Date)   SpO2 98%   BMI 23.34 kg/m      Physical Exam Neck:     Thyroid: No thyroid mass, thyromegaly or thyroid tenderness.  Cardiovascular:     Rate and Rhythm: Normal rate and regular rhythm.     Pulses: Normal pulses.     Heart sounds: Normal heart  sounds.  Pulmonary:     Effort: Pulmonary effort is normal.     Breath sounds: Normal breath sounds.  Musculoskeletal:     Cervical back: Normal range of motion and neck supple.  Lymphadenopathy:     Cervical: No cervical adenopathy.  Neurological:     Mental Status: She is alert and oriented to person, place, and time.  Psychiatric:        Mood and Affect: Mood normal.        Behavior: Behavior normal.        Thought Content: Thought content normal.     Results for orders placed or performed in visit on 09/05/22  POCT urine pregnancy  Result Value Ref Range   Preg Test, Ur Negative Negative      Assessment & Plan:    Problem List Items Addressed This Visit       Other   Contraception management - Primary    LMP 08/10/2022, sexually active, denies need for STD screen. Hx of abnormal PAP-LSIL 2021, she did not schedule appt with GYN as previously discussed. States she is unable to complete PAP today due to cost-self pay. Reports she does complete self breast exam monthly, denies any concerns Advised to contact [place] [facility] for pelvic exam. Negative urine pregnancy today Advised only 90days of contraception will be provided today till PAP smear is  completed. She verbalized understanding.      Relevant Medications   SPRINTEC 28 0.25-35 MG-MCG tablet   Other Relevant Orders   POCT urine pregnancy (Completed)   Depression, recurrent ([place])    Followed by [facility] with [facility]. Current use of fluoxetine and vistaril      Return in about 3 months (around 12/04/2022) for CPE (fasting).     Wilfred Lacy, NP

## 2022-09-05 NOTE — Assessment & Plan Note (Signed)
Followed by [facility] with Transition Therapy. Current use of fluoxetine and vistaril

## 2022-09-05 NOTE — Assessment & Plan Note (Addendum)
LMP 08/10/2022, sexually active, denies need for STD screen. Hx of abnormal PAP-LSIL 2021, she did not schedule appt with GYN as previously discussed. States she is unable to complete PAP today due to cost-self pay. Reports she does complete self breast exam monthly, denies any concerns Advised to contact [place] [facility] for pelvic exam. Negative urine pregnancy today Advised only 90days of contraception will be provided today till PAP smear is completed. She verbalized understanding.

## 2022-09-05 NOTE — Patient Instructions (Addendum)
Call [place] [facility] [phone]. Need to complete PAP in order to get additional refills

## 2022-11-20 ENCOUNTER — Other Ambulatory Visit: Payer: Self-pay | Admitting: Nurse Practitioner

## 2022-11-20 DIAGNOSIS — Z3041 Encounter for surveillance of contraceptive pills: Secondary | ICD-10-CM

## 2023-04-28 IMAGING — CT CT ANGIO CHEST
3 of 7 series · 18 of 36 positions shown · IV contrast (omnipaque)
Comparison: None.

CLINICAL DATA: Patient c/o mid back, chest, and upper abdominal
and this morning. Patient states she has had intermittent numbness
and tingling in her hands since lst night.

EXAM:
CT ANGIOGRAPHY CHEST WITH CONTRAST
TECHNIQUE: Multidetector CT imaging of the chest was performed using the
standard protocol during bolus administration of intravenous
contrast. Multiplanar CT image reconstructions and MIPs were
obtained to evaluate the vascular anatomy.
CONTRAST:  100mL OMNIPAQUE IOHEXOL 350 MG/ML SOLN

[Series 6: thins · axial · 0.62mm/px · z∈[+1356,+1606]mm · 12 of 296 slices shown]
[im 23/296  lung]
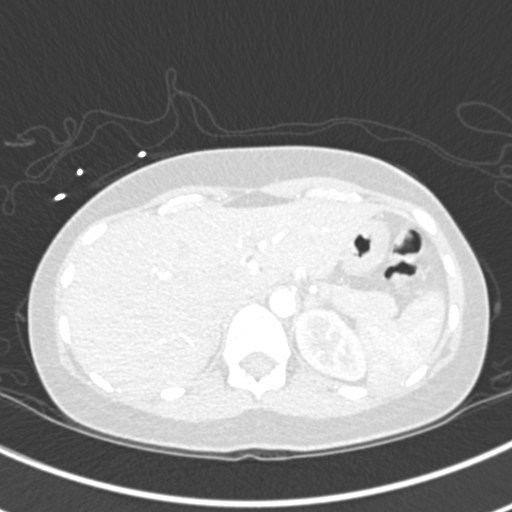
[im 46/296  mediastinal]
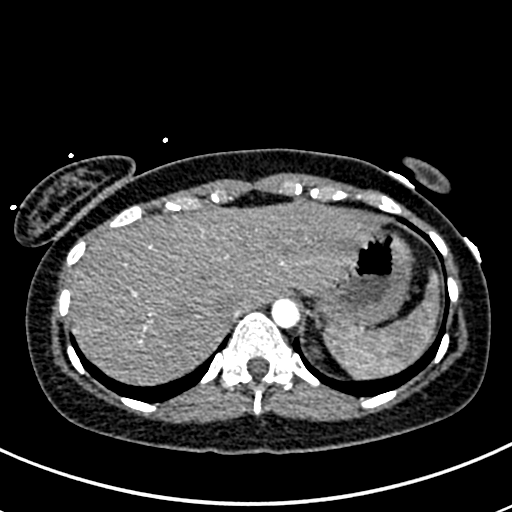
[im 69/296  lung]
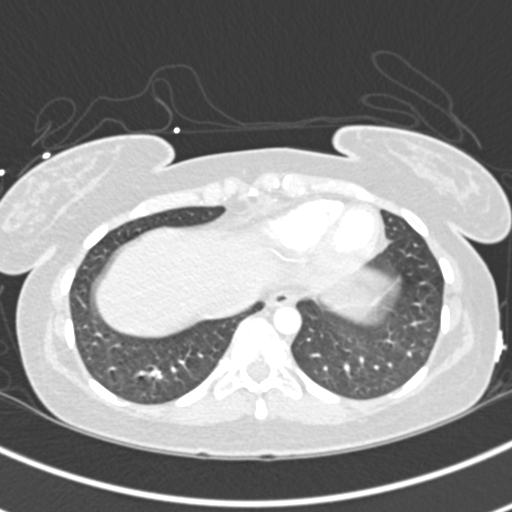
[im 91/296  mediastinal]
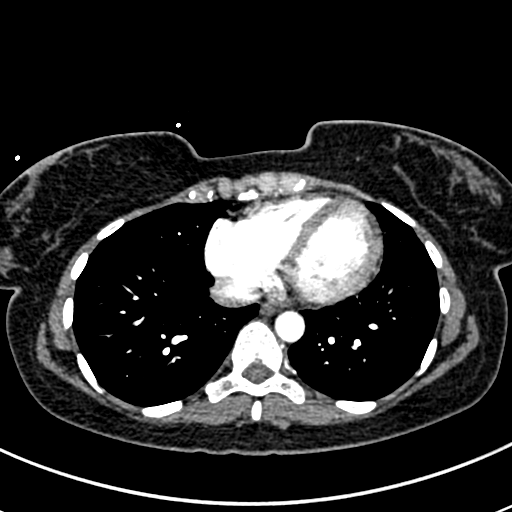
[im 114/296  lung]
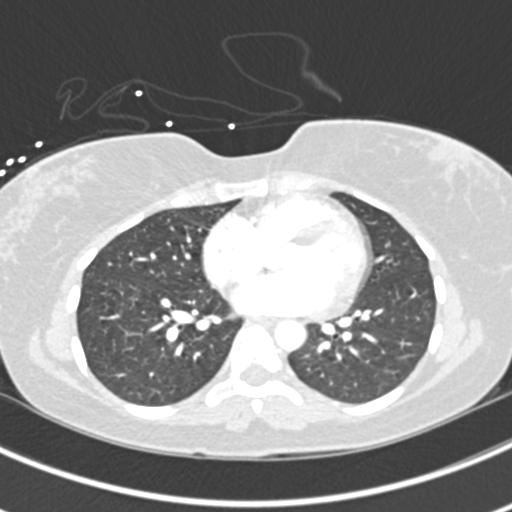
[im 137/296  mediastinal]
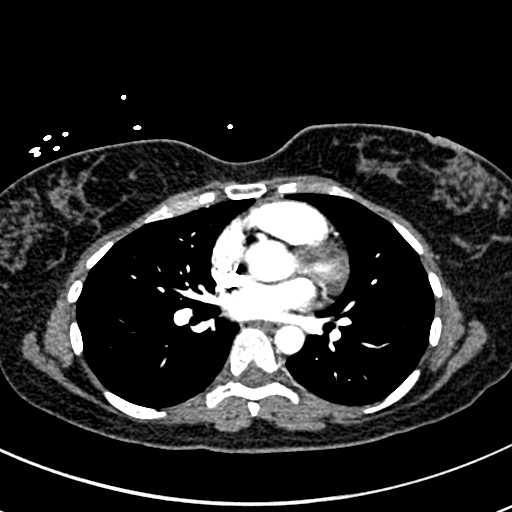
[im 159/296  lung]
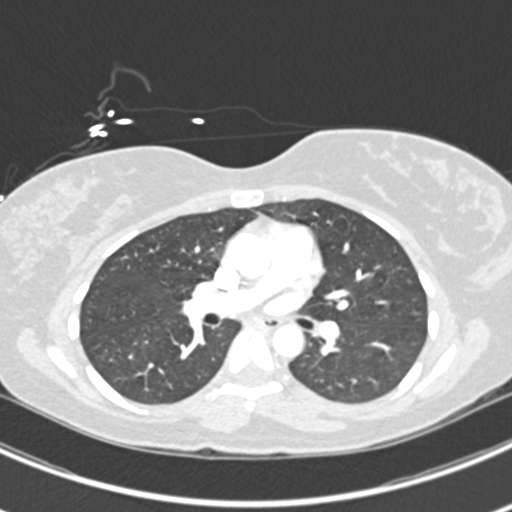
[im 182/296  mediastinal]
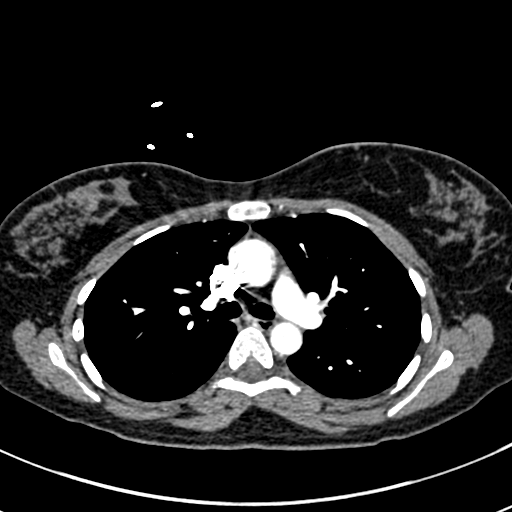
[im 205/296  lung]
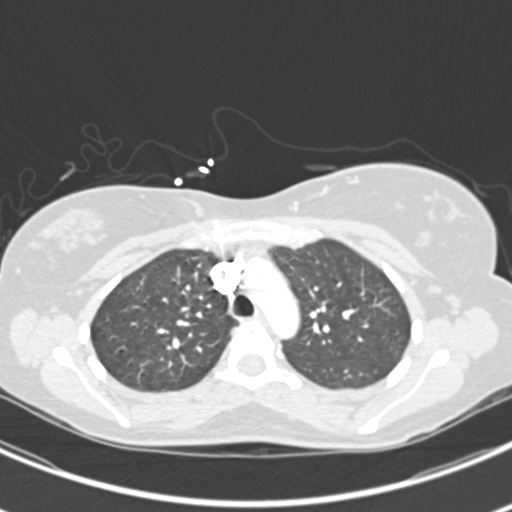
[im 227/296  mediastinal]
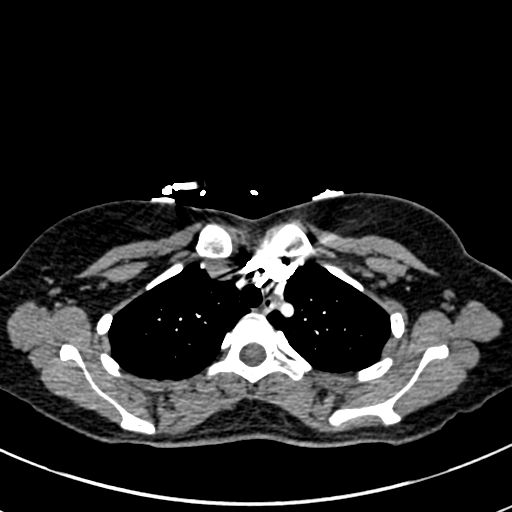
[im 250/296  lung]
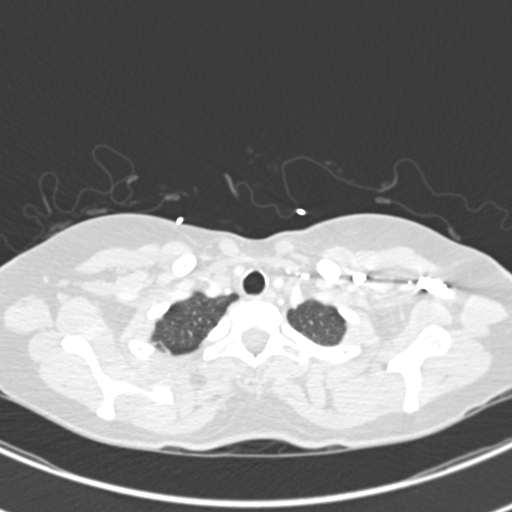
[im 273/296  mediastinal]
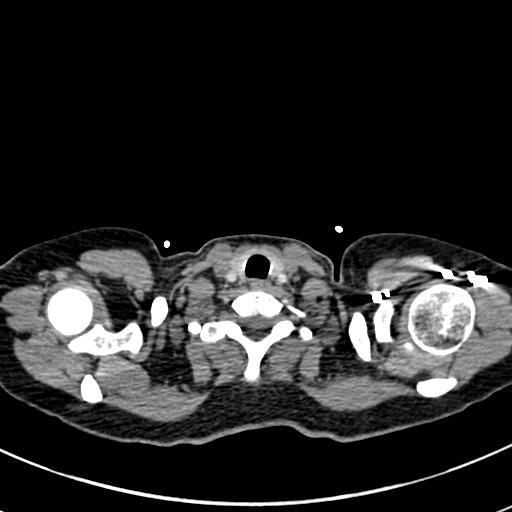

[Series 7: coronal mpr · coronal · 0.54mm/px · 1 of 109 slices shown]
[im 55/109  mediastinal]
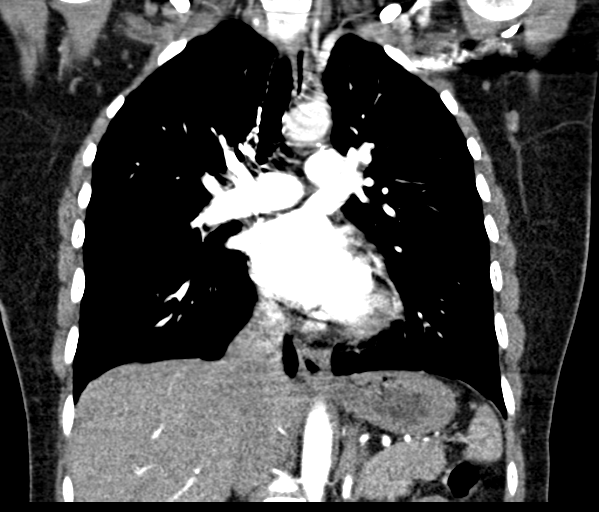

[Series 11: lung · axial · 0.62mm/px · z∈[+1395,+1581]mm · 5 of 141 slices shown]
[im 24/141  mediastinal]
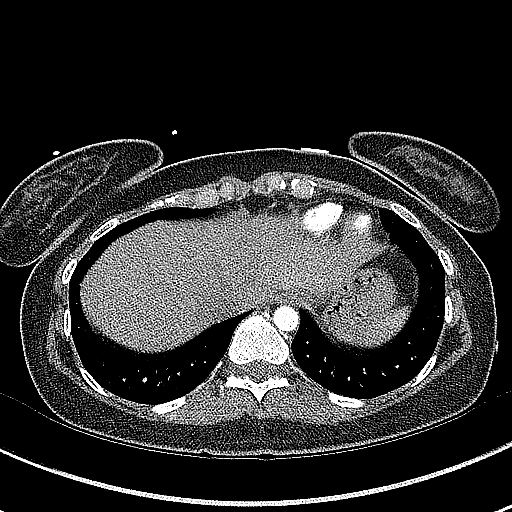
[im 47/141  mediastinal]
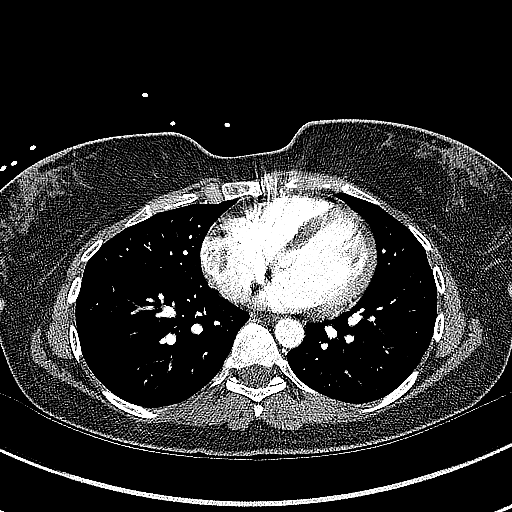
[im 71/141  mediastinal]
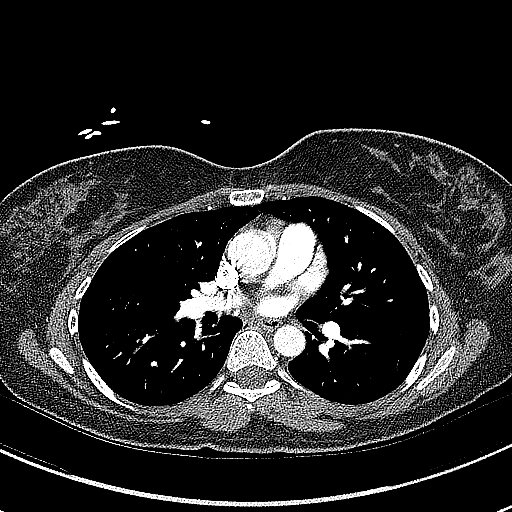
[im 94/141  mediastinal]
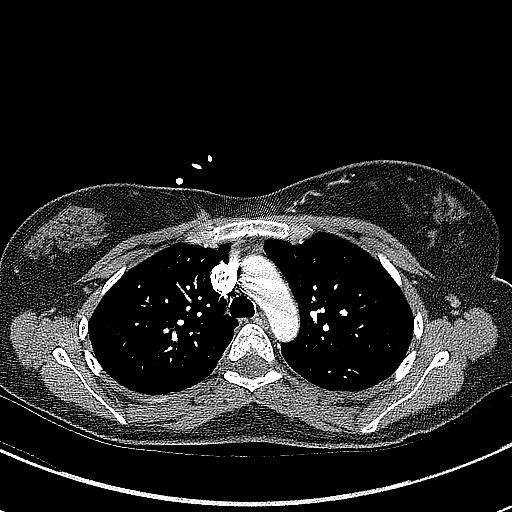
[im 117/141  mediastinal]
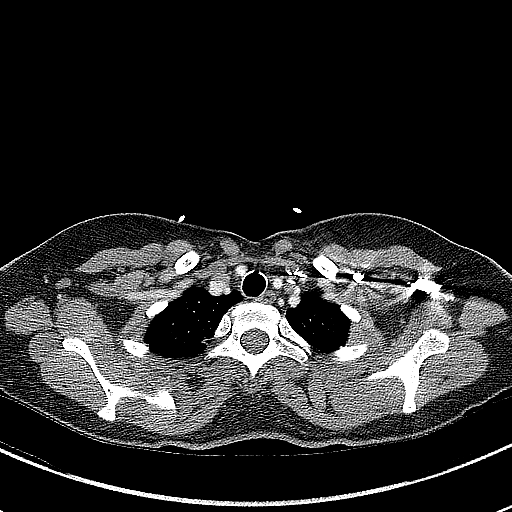

[18 of 36 positions shown; findings below may reference images not displayed]

FINDINGS: Cardiovascular: Satisfactory opacification of the pulmonary arteries
to the segmental level. No evidence of pulmonary embolism. Normal
heart size. No pericardial effusion.

Mediastinum/Nodes: No enlarged mediastinal, hilar, or axillary lymph
nodes. Thyroid gland, trachea, and esophagus demonstrate no
significant findings.

Lungs/Pleura: Central airways are patent. Lungs are clear. No
pleural effusion or pneumothorax.

Upper Abdomen: No acute abnormality.

Musculoskeletal: No chest wall abnormality. No acute or significant
osseous findings.

Review of the MIP images confirms the above findings.
IMPRESSION: Negative for pulmonary embolus or other acute intrathoracic process.

## 2023-05-08 ENCOUNTER — Emergency Department (HOSPITAL_BASED_OUTPATIENT_CLINIC_OR_DEPARTMENT_OTHER)
Admission: EM | Admit: 2023-05-08 | Discharge: 2023-05-08 | Disposition: A | Discharge status: Home / Self Care | Payer: Self-pay | Attending: Emergency Medicine | Admitting: Emergency Medicine

## 2023-05-08 ENCOUNTER — Other Ambulatory Visit: Payer: Self-pay

## 2023-05-08 ENCOUNTER — Other Ambulatory Visit (HOSPITAL_BASED_OUTPATIENT_CLINIC_OR_DEPARTMENT_OTHER): Payer: Self-pay

## 2023-05-08 ENCOUNTER — Encounter (HOSPITAL_BASED_OUTPATIENT_CLINIC_OR_DEPARTMENT_OTHER): Payer: Self-pay | Admitting: Emergency Medicine

## 2023-05-08 DIAGNOSIS — U071 COVID-19: Secondary | ICD-10-CM | POA: Insufficient documentation

## 2023-05-08 LAB — SARS CORONAVIRUS 2 BY RT PCR: SARS Coronavirus 2 by RT PCR: NEGATIVE

## 2023-05-08 MED ORDER — ALBUTEROL SULFATE HFA 108 (90 BASE) MCG/ACT IN AERS
2.0000 | INHALATION_SPRAY | RESPIRATORY_TRACT | 3 refills | Status: AC | PRN
  Filled 2023-05-08: qty 8.5, 30d supply, fill #0

## 2023-05-08 MED ORDER — LIDOCAINE VISCOUS HCL 2 % MT SOLN
15.0000 mL | OROMUCOSAL | 0 refills | Status: AC | PRN
  Filled 2023-05-08: qty 200, 3d supply, fill #0

## 2023-05-08 MED ORDER — BENZONATATE 100 MG PO CAPS
100.0000 mg | ORAL_CAPSULE | Freq: Three times a day (TID) | ORAL | 0 refills | Status: AC
  Filled 2023-05-08: qty 21, 7d supply, fill #0

## 2023-05-08 NOTE — ED Notes (Signed)
Reviewed discharge instructions, medications, and home care with pt. Pt verbalized understanding and had no further questions. Pt exited ED without complications.

## 2023-05-08 NOTE — ED Provider Notes (Signed)
[place] EMERGENCY DEPARTMENT AT [facility] Provider Note   CSN: [id number] Arrival date & time: 05/08/23  1238     History  No chief complaint on file.   Traci Oconnell is a 32 y.o. female.  HPI   Patient is a 32 year old female otherwise healthy on fluoxetine and birth control presenting with a couple of days of sore throat after being exposed to her mother who had recently been diagnosed with COVID-19 and with whom she shares an apartment and helps care for.  The patient is not short of breath and has not been febrile, she does not have myalgias but does have fatigue.  She wishes to be tested for COVID, she has no prior lung disease or immunocompromise  Home Medications Prior to Admission medications   Medication Sig Start Date End Date Taking? Authorizing Provider  albuterol (VENTOLIN HFA) 108 (90 Base) MCG/ACT inhaler Inhale 2 puffs into the lungs every 4 (four) hours as needed for wheezing or shortness of breath. 05/08/23  Yes Eber Hong, MD  benzonatate (TESSALON) 100 MG capsule Take 1 capsule (100 mg total) by mouth every 8 (eight) hours. 05/08/23  Yes Eber Hong, MD  lidocaine (XYLOCAINE) 2 % solution Use as directed 15 mLs in the mouth or throat every 4 (four) hours as needed for mouth pain. 05/08/23  Yes Eber Hong, MD  FLUoxetine (PROZAC) 20 MG capsule Take 20 mg by mouth daily. 05/29/21   [provider]  hydrOXYzine (ATARAX/VISTARIL) 25 MG tablet Take 25 mg by mouth 2 (two) times daily as needed. 05/26/21   [provider]  SPRINTEC 28 0.25-35 MG-MCG tablet Take 1 tablet by mouth daily. Ok to skip placeo pills. Needs repeat PAP to get additional refills 09/05/22   Nche, Bonna Gains, NP      Allergies    Nickel    Review of Systems   Review of Systems  All other systems reviewed and are negative.   Physical Exam Updated Vital Signs BP (!) 146/91 (BP Location: Right Arm)   Pulse 63   Temp 98.7 F (37.1 C) (Oral)   Resp  18   SpO2 100%  Physical Exam Vitals and nursing note reviewed.  Constitutional:      General: She is not in acute distress.    Appearance: She is well-developed.  HENT:     Head: Normocephalic and atraumatic.     Mouth/Throat:     Pharynx: No oropharyngeal exudate.  Eyes:     General: No scleral icterus.       Right eye: No discharge.        Left eye: No discharge.     Conjunctiva/sclera: Conjunctivae normal.     Pupils: Pupils are equal, round, and reactive to light.  Neck:     Thyroid: No thyromegaly.     Vascular: No JVD.  Cardiovascular:     Rate and Rhythm: Normal rate and regular rhythm.     Heart sounds: Normal heart sounds. No murmur heard.    No friction rub. No gallop.  Pulmonary:     Effort: Pulmonary effort is normal. No respiratory distress.     Breath sounds: Normal breath sounds. No wheezing or rales.  Abdominal:     General: Bowel sounds are normal. There is no distension.     Palpations: Abdomen is soft. There is no mass.     Tenderness: There is no abdominal tenderness.  Musculoskeletal:        General: No tenderness. Normal range  of motion.     Cervical back: Normal range of motion and neck supple.     Right lower leg: No edema.     Left lower leg: No edema.  Lymphadenopathy:     Cervical: No cervical adenopathy.  Skin:    General: Skin is warm and dry.     Findings: No erythema or rash.  Neurological:     Mental Status: She is alert.     Coordination: Coordination normal.  Psychiatric:        Behavior: Behavior normal.     ED Results / Procedures / Treatments   Labs (all labs ordered are listed, but only abnormal results are displayed) Labs Reviewed  SARS CORONAVIRUS 2 BY RT PCR    EKG None  Radiology No results found.  Procedures Procedures    Medications Ordered in ED Medications - No data to display  ED Course/ Medical Decision Making/ A&P                                 Medical Decision Making  Exam is reassuring and  unremarkable, vital signs are unremarkable, patient to be tested for COVID, she is already aware that she may not have specific treatment but we will give her supportive care medications and she is agreeable to that.  At the time of change of shift care signed out to oncoming physician Dr. Jarold Motto to follow-up results of COVID test and disposition        Final Clinical Impression(s) / ED Diagnoses Final diagnoses:  COVID-19    Rx / DC Orders ED Discharge Orders          Ordered    benzonatate (TESSALON) 100 MG capsule  Every 8 hours        05/08/23 1446    albuterol (VENTOLIN HFA) 108 (90 Base) MCG/ACT inhaler  Every 4 hours PRN        05/08/23 1446    lidocaine (XYLOCAINE) 2 % solution  Every 4 hours PRN        05/08/23 1446              Eber Hong, MD 05/08/23 1446

## 2023-05-08 NOTE — Discharge Instructions (Addendum)
Your COVID test was negative but given your exposure we will treat you as if this is COVID such as a viral upper respiratory infection, see the medications below  Tessalon is a cough medication that helps reduce the amount of coughing that you are having.  You may take up to 200 mg every 8 hours as needed.  This can be used safely with most or other over-the-counter medications but talk to your pharmacist before taking anything else over-the-counter with it  Albuterol is an inhaled medication which can help you to breathe better, you should take 2 puffs every 4 hours as needed, this may cause your heart to feel like it is racing, this should be a temporary side effect.  Lidocaine viscus solution to help with the sore throat

## 2023-05-08 NOTE — ED Triage Notes (Signed)
Cough/sore throat /headache since last night. Roommate tested positive covid. Pt interested in antiviral if positive
